# Patient Record
Sex: Female | Born: 1955 | Race: Black or African American | Hispanic: No | Marital: Single | Smoking: Never smoker
Health system: Southern US, Community
[De-identification: ages and names within clinical notes are randomized; demographics above are authoritative.]

## PROBLEM LIST (undated history)

## (undated) DIAGNOSIS — I10 Essential (primary) hypertension: Secondary | ICD-10-CM

## (undated) DIAGNOSIS — J45909 Unspecified asthma, uncomplicated: Secondary | ICD-10-CM

## (undated) DIAGNOSIS — E119 Type 2 diabetes mellitus without complications: Secondary | ICD-10-CM

## (undated) DIAGNOSIS — U071 COVID-19: Secondary | ICD-10-CM

## (undated) HISTORY — PX: ABDOMINAL HYSTERECTOMY: SHX81

## (undated) HISTORY — PX: OTHER SURGICAL HISTORY: SHX169

---

## 2013-11-25 ENCOUNTER — Other Ambulatory Visit: Payer: Self-pay | Admitting: Orthopedic Surgery

## 2013-11-25 DIAGNOSIS — M545 Low back pain, unspecified: Secondary | ICD-10-CM

## 2013-11-25 DIAGNOSIS — R200 Anesthesia of skin: Secondary | ICD-10-CM

## 2013-11-25 DIAGNOSIS — R202 Paresthesia of skin: Secondary | ICD-10-CM

## 2013-12-02 ENCOUNTER — Ambulatory Visit
Admission: RE | Admit: 2013-12-02 | Discharge: 2013-12-02 | Disposition: A | Discharge status: Home / Self Care | Payer: Medicare Other | Source: Ambulatory Visit | Attending: Orthopedic Surgery | Admitting: Orthopedic Surgery

## 2013-12-02 DIAGNOSIS — R200 Anesthesia of skin: Secondary | ICD-10-CM

## 2013-12-02 DIAGNOSIS — M545 Low back pain, unspecified: Secondary | ICD-10-CM

## 2013-12-02 DIAGNOSIS — R202 Paresthesia of skin: Secondary | ICD-10-CM

## 2019-05-23 ENCOUNTER — Other Ambulatory Visit: Payer: Self-pay

## 2019-05-23 ENCOUNTER — Emergency Department (HOSPITAL_COMMUNITY): Payer: Medicare Other

## 2019-05-23 ENCOUNTER — Encounter (HOSPITAL_COMMUNITY): Payer: Self-pay

## 2019-05-23 ENCOUNTER — Emergency Department (HOSPITAL_COMMUNITY)
Admission: EM | Admit: 2019-05-23 | Discharge: 2019-05-23 | Disposition: A | Discharge status: Home / Self Care | Payer: Medicare Other | Source: Home / Self Care | Attending: Emergency Medicine | Admitting: Emergency Medicine

## 2019-05-23 DIAGNOSIS — R197 Diarrhea, unspecified: Secondary | ICD-10-CM

## 2019-05-23 DIAGNOSIS — I10 Essential (primary) hypertension: Secondary | ICD-10-CM | POA: Insufficient documentation

## 2019-05-23 DIAGNOSIS — U071 COVID-19: Secondary | ICD-10-CM | POA: Insufficient documentation

## 2019-05-23 DIAGNOSIS — R1084 Generalized abdominal pain: Secondary | ICD-10-CM

## 2019-05-23 DIAGNOSIS — E119 Type 2 diabetes mellitus without complications: Secondary | ICD-10-CM | POA: Insufficient documentation

## 2019-05-23 DIAGNOSIS — E876 Hypokalemia: Secondary | ICD-10-CM

## 2019-05-23 DIAGNOSIS — R112 Nausea with vomiting, unspecified: Secondary | ICD-10-CM

## 2019-05-23 DIAGNOSIS — J45909 Unspecified asthma, uncomplicated: Secondary | ICD-10-CM | POA: Insufficient documentation

## 2019-05-23 HISTORY — DX: Unspecified asthma, uncomplicated: J45.909

## 2019-05-23 HISTORY — DX: Type 2 diabetes mellitus without complications: E11.9

## 2019-05-23 HISTORY — DX: Essential (primary) hypertension: I10

## 2019-05-23 LAB — COMPREHENSIVE METABOLIC PANEL
ALT: 39 U/L (ref 0–44)
AST: 36 U/L (ref 15–41)
Albumin: 3.8 g/dL (ref 3.5–5.0)
Alkaline Phosphatase: 64 U/L (ref 38–126)
Anion gap: 10 (ref 5–15)
BUN: 8 mg/dL (ref 8–23)
CO2: 25 mmol/L (ref 22–32)
Calcium: 8.6 mg/dL — ABNORMAL LOW (ref 8.9–10.3)
Chloride: 103 mmol/L (ref 98–111)
Creatinine, Ser: 0.86 mg/dL (ref 0.44–1.00)
GFR calc Af Amer: >60 mL/min (ref >60)
GFR calc non Af Amer: >60 mL/min (ref >60)
Glucose, Bld: 112 mg/dL — ABNORMAL HIGH (ref 70–99)
Potassium: 3.3 mmol/L — ABNORMAL LOW (ref 3.5–5.1)
Sodium: 138 mmol/L (ref 135–145)
Total Bilirubin: 1 mg/dL (ref 0.3–1.2)
Total Protein: 7.4 g/dL (ref 6.5–8.1)

## 2019-05-23 LAB — CBC WITH DIFFERENTIAL/PLATELET
Abs Immature Granulocytes: 0.01 10*3/uL (ref 0.00–0.07)
Basophils Absolute: 0 10*3/uL (ref 0.0–0.1)
Basophils Relative: 0 %
Eosinophils Absolute: 0 10*3/uL (ref 0.0–0.5)
Eosinophils Relative: 0 %
HCT: 40.8 % (ref 36.0–46.0)
Hemoglobin: 13.2 g/dL (ref 12.0–15.0)
Immature Granulocytes: 0 %
Lymphocytes Relative: 20 %
Lymphs Abs: 1.1 10*3/uL (ref 0.7–4.0)
MCH: 27.8 pg (ref 26.0–34.0)
MCHC: 32.4 g/dL (ref 30.0–36.0)
MCV: 85.9 fL (ref 80.0–100.0)
Monocytes Absolute: 0.4 10*3/uL (ref 0.1–1.0)
Monocytes Relative: 7 %
Neutro Abs: 3.9 10*3/uL (ref 1.7–7.7)
Neutrophils Relative %: 73 %
Platelets: 172 10*3/uL (ref 150–400)
RBC: 4.75 MIL/uL (ref 3.87–5.11)
RDW: 13.2 % (ref 11.5–15.5)
WBC: 5.4 10*3/uL (ref 4.0–10.5)
nRBC: 0 % (ref 0.0–0.2)

## 2019-05-23 LAB — URINALYSIS, ROUTINE W REFLEX MICROSCOPIC
Bacteria, UA: NONE SEEN
Bilirubin Urine: NEGATIVE
Glucose, UA: NEGATIVE mg/dL
Ketones, ur: NEGATIVE mg/dL
Leukocytes,Ua: NEGATIVE
Nitrite: NEGATIVE
Protein, ur: NEGATIVE mg/dL
Specific Gravity, Urine: 1.008 (ref 1.005–1.030)
pH: 6 (ref 5.0–8.0)

## 2019-05-23 LAB — LIPASE, BLOOD: Lipase: 39 U/L (ref 11–51)

## 2019-05-23 MED ORDER — SODIUM CHLORIDE 0.9 % IV BOLUS
500.0000 mL | Freq: Once | INTRAVENOUS | Status: AC
  Administered 2019-05-23: 500 mL via INTRAVENOUS

## 2019-05-23 MED ORDER — ALUM & MAG HYDROXIDE-SIMETH 200-200-20 MG/5ML PO SUSP
15.0000 mL | Freq: Once | ORAL | Status: AC
  Administered 2019-05-23: 15 mL via ORAL
  Filled 2019-05-23: qty 30

## 2019-05-23 MED ORDER — LOPERAMIDE HCL 2 MG PO CAPS: 2.0000 mg | ORAL_CAPSULE | Freq: Four times a day (QID) | ORAL | 0 refills | Status: AC | PRN

## 2019-05-23 MED ORDER — POTASSIUM CHLORIDE CRYS ER 20 MEQ PO TBCR: 20.0000 meq | EXTENDED_RELEASE_TABLET | Freq: Two times a day (BID) | ORAL | 0 refills | Status: DC

## 2019-05-23 MED ORDER — DICYCLOMINE HCL 10 MG/ML IM SOLN
20.0000 mg | Freq: Once | INTRAMUSCULAR | Status: AC
  Administered 2019-05-23: 09:00:00 20 mg via INTRAMUSCULAR
  Filled 2019-05-23 (×2): qty 2

## 2019-05-23 MED ORDER — DICYCLOMINE HCL 20 MG PO TABS: 20.0000 mg | ORAL_TABLET | Freq: Two times a day (BID) | ORAL | 0 refills | Status: AC

## 2019-05-23 MED ORDER — ONDANSETRON HCL 4 MG/2ML IJ SOLN
4.0000 mg | Freq: Once | INTRAMUSCULAR | Status: AC
  Administered 2019-05-23: 09:00:00 4 mg via INTRAVENOUS
  Filled 2019-05-23: qty 2

## 2019-05-23 NOTE — Discharge Instructions (Signed)
Person Under Monitoring Name: Tanya Boyd  Location: [place] [address]   Infection Prevention Recommendations for Individuals Confirmed to have, or Being Evaluated for, 2019 Novel Coronavirus (COVID-19) Infection Who Receive Care at Home  Individuals who are confirmed to have, or are being evaluated for, COVID-19 should follow the prevention steps below until a healthcare provider or local or state health department says they can return to normal activities.  Stay home except to get medical care You should restrict activities outside your home, except for getting medical care. Do not go to work, school, or public areas, and do not use public transportation or taxis.  Call ahead before visiting your doctor Before your medical appointment, call the healthcare provider and tell them that you have, or are being evaluated for, COVID-19 infection. This will help the healthcare providers office take steps to keep other people from getting infected. Ask your healthcare provider to call the local or state health department.  Monitor your symptoms Seek prompt medical attention if your illness is worsening (e.g., difficulty breathing). Before going to your medical appointment, call the healthcare provider and tell them that you have, or are being evaluated for, COVID-19 infection. Ask your healthcare provider to call the local or state health department.  Wear a facemask You should wear a facemask that covers your nose and mouth when you are in the same room with other people and when you visit a healthcare provider. People who live with or visit you should also wear a facemask while they are in the same room with you.  Separate yourself from other people in your home As much as possible, you should stay in a different room from other people in your home. Also, you should use a separate bathroom, if available.  Avoid sharing household items You should not  share dishes, drinking glasses, cups, eating utensils, towels, bedding, or other items with other people in your home. After using these items, you should wash them thoroughly with soap and water.  Cover your coughs and sneezes Cover your mouth and nose with a tissue when you cough or sneeze, or you can cough or sneeze into your sleeve. Throw used tissues in a lined trash can, and immediately wash your hands with soap and water for at least 20 seconds or use an alcohol-based hand rub.  Wash your [facility] your hands often and thoroughly with soap and water for at least 20 seconds. You can use an alcohol-based hand sanitizer if soap and water are not available and if your hands are not visibly dirty. Avoid touching your eyes, nose, and mouth with unwashed hands.   Prevention Steps for Caregivers and Household Members of Individuals Confirmed to have, or Being Evaluated for, COVID-19 Infection Being Cared for in the Home  If you live with, or provide care at home for, a person confirmed to have, or being evaluated for, COVID-19 infection please follow these guidelines to prevent infection:  Follow healthcare providers instructions Make sure that you understand and can help the patient follow any healthcare provider instructions for all care.  Provide for the patients basic needs You should help the patient with basic needs in the home and provide support for getting groceries, prescriptions, and other personal needs.  Monitor the patients symptoms If they are getting sicker, call his or her medical provider and tell them that the patient has, or is being evaluated for, COVID-19 infection. This will help the healthcare providers office  take steps to keep other people from getting infected. Ask the healthcare provider to call the local or state health department.  Limit the number of people who have contact with the patient If possible, have only one caregiver for the  patient. Other household members should stay in another home or place of residence. If this is not possible, they should stay in another room, or be separated from the patient as much as possible. Use a separate bathroom, if available. Restrict visitors who do not have an essential need to be in the home.  Keep older adults, very young children, and other sick people away from the patient Keep older adults, very young children, and those who have compromised immune systems or chronic health conditions away from the patient. This includes people with chronic heart, lung, or kidney conditions, diabetes, and cancer.  Ensure good ventilation Make sure that shared spaces in the home have good air flow, such as from an air conditioner or an opened window, weather permitting.  Wash your hands often Wash your hands often and thoroughly with soap and water for at least 20 seconds. You can use an alcohol based hand sanitizer if soap and water are not available and if your hands are not visibly dirty. Avoid touching your eyes, nose, and mouth with unwashed hands. Use disposable paper towels to dry your hands. If not available, use dedicated cloth towels and replace them when they become wet.  Wear a facemask and gloves Wear a disposable facemask at all times in the room and gloves when you touch or have contact with the patients blood, body fluids, and/or secretions or excretions, such as sweat, saliva, sputum, nasal mucus, vomit, urine, or feces.  Ensure the mask fits over your nose and mouth tightly, and do not touch it during use. Throw out disposable facemasks and gloves after using them. Do not reuse. Wash your hands immediately after removing your facemask and gloves. If your personal clothing becomes contaminated, carefully remove clothing and launder. Wash your hands after handling contaminated clothing. Place all used disposable facemasks, gloves, and other waste in a lined container before  disposing them with other household waste. Remove gloves and wash your hands immediately after handling these items.  Do not share dishes, glasses, or other household items with the patient Avoid sharing household items. You should not share dishes, drinking glasses, cups, eating utensils, towels, bedding, or other items with a patient who is confirmed to have, or being evaluated for, COVID-19 infection. After the person uses these items, you should wash them thoroughly with soap and water.  Wash laundry thoroughly Immediately remove and wash clothes or bedding that have blood, body fluids, and/or secretions or excretions, such as sweat, saliva, sputum, nasal mucus, vomit, urine, or feces, on them. Wear gloves when handling laundry from the patient. Read and follow directions on labels of laundry or clothing items and detergent. In general, wash and dry with the warmest temperatures recommended on the label.  Clean all areas the individual has used often Clean all touchable surfaces, such as counters, tabletops, doorknobs, bathroom fixtures, toilets, phones, keyboards, tablets, and bedside tables, every day. Also, clean any surfaces that may have blood, body fluids, and/or secretions or excretions on them. Wear gloves when cleaning surfaces the patient has come in contact with. Use a diluted bleach solution (e.g., dilute bleach with 1 part bleach and 10 parts water) or a household disinfectant with a label that says EPA-registered for coronaviruses. To make a bleach  solution at home, add 1 tablespoon of bleach to 1 quart (4 cups) of water. For a larger supply, add  cup of bleach to 1 gallon (16 cups) of water. Read labels of cleaning products and follow recommendations provided on product labels. Labels contain instructions for safe and effective use of the cleaning product including precautions you should take when applying the product, such as wearing gloves or eye protection and making sure you  have good ventilation during use of the product. Remove gloves and wash hands immediately after cleaning.  Monitor yourself for signs and symptoms of illness Caregivers and household members are considered close contacts, should monitor their health, and will be asked to limit movement outside of the home to the extent possible. Follow the monitoring steps for close contacts listed on the symptom monitoring form.   ? If you have additional questions, contact your local health department or call the epidemiologist on call at [phone] (available 24/7). ? This guidance is subject to change. For the most up-to-date guidance from [facility], please refer to their website: [email or web address]

## 2019-05-23 NOTE — ED Provider Notes (Signed)
Emergency Department Provider Note   I have reviewed the triage vital signs and the nursing notes.   HISTORY  Chief Complaint Abdominal Pain and covid   HPI Tanya Boyd is a 63 y.o. female with PMH of asthma, DM, and HTN presents to the emergency department for evaluation of cough, abdominal pain, vomiting, diarrhea.  Symptoms have been present for the past 4 days.  She was tested for COVID-19 last week and her test came back positive on Sunday.  She denies significant shortness of breath or chest pain.  She does feel fatigue and body aches.  She denies blood in the emesis or stool.  She has multiple episodes of both vomiting and diarrhea per day.  She is drinking and eating less than normal.  She has been taking omeprazole which was initially prescribed for her abdominal discomfort but is not helping.  She also notes taking a Zofran with some mild, transient relief.    Past Medical History:  Diagnosis Date  . Asthma   . Diabetes mellitus without complication (HCC)   . Hypertension     There are no active problems to display for this patient.   Past Surgical History:  Procedure Laterality Date  . ABDOMINAL HYSTERECTOMY    . arm surgery      Allergies Lisinopril and Penicillins  No family history on file.  Social History Social History   Tobacco Use  . Smoking status: Never Smoker  . Smokeless tobacco: Never Used  Substance Use Topics  . Alcohol use: Never    Frequency: Never  . Drug use: Never    Review of Systems  Constitutional: Positive fever/chills and body aches.  Eyes: No visual changes. ENT: No sore throat. Cardiovascular: Denies chest pain. Respiratory: Denies shortness of breath. Positive cough.  Gastrointestinal: Positive diffuse abdominal pain. Positive nausea, vomiting, and diarrhea.  No constipation. Genitourinary: Negative for dysuria. Musculoskeletal: Negative for back pain. Skin: Negative for rash. Neurological: Negative for  headaches, focal weakness or numbness.  10-point ROS otherwise negative.  ____________________________________________   PHYSICAL EXAM:  VITAL SIGNS: ED Triage Vitals  Enc Vitals Group     BP 05/23/19 0826 114/77     Pulse Rate 05/23/19 0826 69     Resp 05/23/19 0826 18     Temp 05/23/19 0826 99.5 F (37.5 C)     Temp Source 05/23/19 0826 Oral     SpO2 05/23/19 0826 96 %     Weight 05/23/19 0822 226 lb (102.5 kg)     Height 05/23/19 0822 5\' 7"  (1.702 m)   Constitutional: Alert and oriented. Well appearing and in no acute distress. Eyes: Conjunctivae are normal.  Head: Atraumatic. Nose: No congestion/rhinnorhea. Mouth/Throat: Mucous membranes are moist.   Neck: No stridor.   Cardiovascular: Normal rate, regular rhythm. Good peripheral circulation. Grossly normal heart sounds.   Respiratory: Normal respiratory effort.  No retractions. Lungs CTAB. Gastrointestinal: Soft with mild, diffuse tenderness. No rebound or guarding. No distention.  Musculoskeletal: No lower extremity tenderness nor edema. No gross deformities of extremities. Neurologic:  Normal speech and language. Skin:  Skin is warm, dry and intact. No rash noted.  ____________________________________________   LABS (all labs ordered are listed, but only abnormal results are displayed)  Labs Reviewed  COMPREHENSIVE METABOLIC PANEL - Abnormal; Notable for the following components:      Result Value   Potassium 3.3 (*)    Glucose, Bld 112 (*)    Calcium 8.6 (*)    All other  components within normal limits  URINALYSIS, ROUTINE W REFLEX MICROSCOPIC - Abnormal; Notable for the following components:   Hgb urine dipstick SMALL (*)    All other components within normal limits  LIPASE, BLOOD  CBC WITH DIFFERENTIAL/PLATELET   ____________________________________________  EKG   EKG Interpretation  Date/Time:  Thursday May 23 2019 08:58:03 EDT Ventricular Rate:  69 PR Interval:    QRS Duration: 88 QT  Interval:  440 QTC Calculation: 472 R Axis:   63 Text Interpretation:  Sinus rhythm Multiple premature complexes, vent & supraven Borderline short PR interval Abnormal R-wave progression, early transition T wave inversions anterior and lateral. No STEMI.  No old tracing for comparison.  Confirmed by Alona Bene [phone]) on 05/23/2019 9:39:21 AM       ____________________________________________  RADIOLOGY  Dg Chest Portable 1 View  Result Date: 05/23/2019 CLINICAL DATA:  COVID-19, cough, abdominal pain, vomiting EXAM: PORTABLE CHEST 1 VIEW COMPARISON:  None. FINDINGS: Cardiomegaly. There is subtle bilateral heterogeneous opacity, most conspicuous in the peripheral mid lungs. Overlying soft tissue may exaggerate this appearance. The visualized skeletal structures are unremarkable. IMPRESSION: Cardiomegaly. There is subtle bilateral heterogeneous opacity, most conspicuous in the peripheral mid lungs, generally in keeping with reported COVID-19 diagnosis. Overlying soft tissue may exaggerate this appearance. Electronically Signed   By: Lauralyn Primes M.D.   On: 05/23/2019 10:56    ____________________________________________   PROCEDURES  Procedure(s) performed:   Procedures  None ____________________________________________   INITIAL IMPRESSION / ASSESSMENT AND PLAN / ED COURSE  Pertinent labs & imaging results that were available during my care of the patient were reviewed by me and considered in my medical decision making (see chart for details).   Patient with known COVID-19 infection presents to the emergency department primarily with gastrointestinal symptoms.  She reports vomiting and diarrhea.  Her abdominal pain is diffuse with mild tenderness on exam.  Suspect that this is consistent with her viral syndrome.  No focal findings on exam to require CT imaging at this time.  Patient is having cough but no other cardiovascular or pulmonary symptoms at this time.  No hypoxemia.   Afebrile here.  Plan for IV fluids, symptom management, screening lab work, chest x-ray, reassess.  Labs reviewed. No acute findings. CXR unremarkable. Patient improved with supportive care in the ED. Discussed ED return precautions. Will remain isolated.   Charman Collum was evaluated in Emergency Department on 05/23/2019 for the symptoms described in the history of present illness. She was evaluated in the context of the global COVID-19 pandemic, which necessitated consideration that the patient might be at risk for infection with the SARS-CoV-2 virus that causes COVID-19. Institutional protocols and algorithms that pertain to the evaluation of patients at risk for COVID-19 are in a state of rapid change based on information released by regulatory bodies including the CDC and federal and state organizations. These policies and algorithms were followed during the patient's care in the ED.  ____________________________________________  FINAL CLINICAL IMPRESSION(S) / ED DIAGNOSES  Final diagnoses:  Generalized abdominal pain  Nausea vomiting and diarrhea  COVID-19  Hypokalemia     MEDICATIONS GIVEN DURING THIS VISIT:  Medications  sodium chloride 0.9 % bolus 500 mL (0 mLs Intravenous Stopped 05/23/19 1033)  dicyclomine (BENTYL) injection 20 mg (20 mg Intramuscular Given 05/23/19 0909)  ondansetron (ZOFRAN) injection 4 mg (4 mg Intravenous Given 05/23/19 0909)  alum & mag hydroxide-simeth (MAALOX/MYLANTA) 200-200-20 MG/5ML suspension 15 mL (15 mLs Oral Given 05/23/19 0910)  NEW OUTPATIENT MEDICATIONS STARTED DURING THIS VISIT:  Discharge Medication List as of 05/23/2019 11:23 AM    START taking these medications   Details  dicyclomine (BENTYL) 20 MG tablet Take 1 tablet (20 mg total) by mouth 2 (two) times daily., Starting Thu 05/23/2019, Normal    loperamide (IMODIUM) 2 MG capsule Take 1 capsule (2 mg total) by mouth 4 (four) times daily as needed for diarrhea or loose stools.,  Starting Thu 05/23/2019, Normal    potassium chloride SA (K-DUR) 20 MEQ tablet Take 1 tablet (20 mEq total) by mouth 2 (two) times daily for 5 days., Starting Thu 05/23/2019, Until Tue 05/28/2019, Normal        Note:  This document was prepared using Dragon voice recognition software and may include unintentional dictation errors.  Nanda Quinton, MD Emergency Medicine    Long, [facility], MD 05/23/19 2011

## 2019-05-23 NOTE — ED Notes (Signed)
Computer not working in room 19, unable to obtain d/c signature.  pt verbalized understanding of d/c instructions.

## 2019-05-23 NOTE — ED Triage Notes (Signed)
Pt reports cough, abd pain, vomiting, and diarrhea since sat.  Reports tested positive for covid Sunday.

## 2019-05-26 ENCOUNTER — Inpatient Hospital Stay (HOSPITAL_COMMUNITY)
Admission: EM | Admit: 2019-05-26 | Discharge: 2019-05-28 | DRG: 177 | Disposition: A | Discharge status: Home / Self Care | Payer: Medicare Other | Attending: Internal Medicine | Admitting: Internal Medicine

## 2019-05-26 ENCOUNTER — Other Ambulatory Visit: Payer: Self-pay

## 2019-05-26 ENCOUNTER — Encounter (HOSPITAL_COMMUNITY): Payer: Self-pay

## 2019-05-26 ENCOUNTER — Emergency Department (HOSPITAL_COMMUNITY): Payer: Medicare Other

## 2019-05-26 DIAGNOSIS — R0602 Shortness of breath: Secondary | ICD-10-CM

## 2019-05-26 DIAGNOSIS — J069 Acute upper respiratory infection, unspecified: Secondary | ICD-10-CM | POA: Diagnosis present

## 2019-05-26 DIAGNOSIS — E119 Type 2 diabetes mellitus without complications: Secondary | ICD-10-CM | POA: Diagnosis present

## 2019-05-26 DIAGNOSIS — Z888 Allergy status to other drugs, medicaments and biological substances status: Secondary | ICD-10-CM

## 2019-05-26 DIAGNOSIS — I10 Essential (primary) hypertension: Secondary | ICD-10-CM

## 2019-05-26 DIAGNOSIS — J1289 Other viral pneumonia: Secondary | ICD-10-CM | POA: Diagnosis present

## 2019-05-26 DIAGNOSIS — Z7984 Long term (current) use of oral hypoglycemic drugs: Secondary | ICD-10-CM | POA: Diagnosis not present

## 2019-05-26 DIAGNOSIS — Z79899 Other long term (current) drug therapy: Secondary | ICD-10-CM

## 2019-05-26 DIAGNOSIS — U071 COVID-19: Principal | ICD-10-CM | POA: Diagnosis present

## 2019-05-26 DIAGNOSIS — J45901 Unspecified asthma with (acute) exacerbation: Secondary | ICD-10-CM | POA: Diagnosis present

## 2019-05-26 DIAGNOSIS — R0902 Hypoxemia: Secondary | ICD-10-CM | POA: Diagnosis present

## 2019-05-26 DIAGNOSIS — Z7982 Long term (current) use of aspirin: Secondary | ICD-10-CM

## 2019-05-26 DIAGNOSIS — Z880 Allergy status to penicillin: Secondary | ICD-10-CM

## 2019-05-26 DIAGNOSIS — E876 Hypokalemia: Secondary | ICD-10-CM | POA: Diagnosis not present

## 2019-05-26 DIAGNOSIS — Z90710 Acquired absence of both cervix and uterus: Secondary | ICD-10-CM

## 2019-05-26 DIAGNOSIS — I119 Hypertensive heart disease without heart failure: Secondary | ICD-10-CM | POA: Diagnosis not present

## 2019-05-26 DIAGNOSIS — R1084 Generalized abdominal pain: Secondary | ICD-10-CM | POA: Diagnosis present

## 2019-05-26 DIAGNOSIS — Z7951 Long term (current) use of inhaled steroids: Secondary | ICD-10-CM

## 2019-05-26 LAB — BASIC METABOLIC PANEL
Anion gap: 11 (ref 5–15)
BUN: 8 mg/dL (ref 8–23)
CO2: 23 mmol/L (ref 22–32)
Calcium: 8.8 mg/dL — ABNORMAL LOW (ref 8.9–10.3)
Chloride: 106 mmol/L (ref 98–111)
Creatinine, Ser: 0.84 mg/dL (ref 0.44–1.00)
GFR calc Af Amer: >60 mL/min (ref >60)
GFR calc non Af Amer: >60 mL/min (ref >60)
Glucose, Bld: 106 mg/dL — ABNORMAL HIGH (ref 70–99)
Potassium: 3.4 mmol/L — ABNORMAL LOW (ref 3.5–5.1)
Sodium: 140 mmol/L (ref 135–145)

## 2019-05-26 LAB — D-DIMER, QUANTITATIVE: D-Dimer, Quant: 0.46 ug/mL-FEU (ref 0.00–0.50)

## 2019-05-26 LAB — PROCALCITONIN: Procalcitonin: <0.1 ng/mL

## 2019-05-26 LAB — HEPATIC FUNCTION PANEL
ALT: 26 U/L (ref 0–44)
AST: 28 U/L (ref 15–41)
Albumin: 3.5 g/dL (ref 3.5–5.0)
Alkaline Phosphatase: 62 U/L (ref 38–126)
Bilirubin, Direct: 0.2 mg/dL (ref 0.0–0.2)
Indirect Bilirubin: 0.8 mg/dL (ref 0.3–0.9)
Total Bilirubin: 1 mg/dL (ref 0.3–1.2)
Total Protein: 7.3 g/dL (ref 6.5–8.1)

## 2019-05-26 LAB — CBC WITH DIFFERENTIAL/PLATELET
Abs Immature Granulocytes: 0.02 10*3/uL (ref 0.00–0.07)
Basophils Absolute: 0 10*3/uL (ref 0.0–0.1)
Basophils Relative: 0 %
Eosinophils Absolute: 0 10*3/uL (ref 0.0–0.5)
Eosinophils Relative: 0 %
HCT: 40.6 % (ref 36.0–46.0)
Hemoglobin: 12.9 g/dL (ref 12.0–15.0)
Immature Granulocytes: 0 %
Lymphocytes Relative: 18 %
Lymphs Abs: 1.3 10*3/uL (ref 0.7–4.0)
MCH: 27.5 pg (ref 26.0–34.0)
MCHC: 31.8 g/dL (ref 30.0–36.0)
MCV: 86.6 fL (ref 80.0–100.0)
Monocytes Absolute: 0.3 10*3/uL (ref 0.1–1.0)
Monocytes Relative: 5 %
Neutro Abs: 5.2 10*3/uL (ref 1.7–7.7)
Neutrophils Relative %: 77 %
Platelets: 191 10*3/uL (ref 150–400)
RBC: 4.69 MIL/uL (ref 3.87–5.11)
RDW: 13.4 % (ref 11.5–15.5)
WBC: 6.8 10*3/uL (ref 4.0–10.5)
nRBC: 0 % (ref 0.0–0.2)

## 2019-05-26 LAB — SEDIMENTATION RATE: Sed Rate: 45 mm/hr — ABNORMAL HIGH (ref 0–22)

## 2019-05-26 LAB — LACTIC ACID, PLASMA
Lactic Acid, Venous: 1.2 mmol/L (ref 0.5–1.9)
Lactic Acid, Venous: 1.2 mmol/L (ref 0.5–1.9)

## 2019-05-26 LAB — LACTATE DEHYDROGENASE: LDH: 208 U/L — ABNORMAL HIGH (ref 98–192)

## 2019-05-26 LAB — BLOOD GAS, ARTERIAL
Acid-base deficit: 0.8 mmol/L (ref 0.0–2.0)
Bicarbonate: 24.2 mmol/L (ref 20.0–28.0)
FIO2: 21
O2 Saturation: 94.8 %
Patient temperature: 37.2
pCO2 arterial: 33.5 mmHg (ref 32.0–48.0)
pH, Arterial: 7.447 (ref 7.350–7.450)
pO2, Arterial: 75.5 mmHg — ABNORMAL LOW (ref 83.0–108.0)

## 2019-05-26 LAB — FIBRINOGEN: Fibrinogen: 655 mg/dL — ABNORMAL HIGH (ref 210–475)

## 2019-05-26 LAB — GLUCOSE, CAPILLARY: Glucose-Capillary: 183 mg/dL — ABNORMAL HIGH (ref 70–99)

## 2019-05-26 LAB — SARS CORONAVIRUS 2 BY RT PCR (HOSPITAL ORDER, PERFORMED IN ~~LOC~~ HOSPITAL LAB): SARS Coronavirus 2: POSITIVE — AB

## 2019-05-26 LAB — TROPONIN I (HIGH SENSITIVITY)
Troponin I (High Sensitivity): 6 ng/L (ref <18)
Troponin I (High Sensitivity): 6 ng/L (ref <18)

## 2019-05-26 LAB — MAGNESIUM: Magnesium: 1.9 mg/dL (ref 1.7–2.4)

## 2019-05-26 LAB — C-REACTIVE PROTEIN: CRP: 6 mg/dL — ABNORMAL HIGH (ref <1.0)

## 2019-05-26 LAB — FERRITIN: Ferritin: 400 ng/mL — ABNORMAL HIGH (ref 11–307)

## 2019-05-26 MED ORDER — MOMETASONE FURO-FORMOTEROL FUM 200-5 MCG/ACT IN AERO
2.0000 | INHALATION_SPRAY | Freq: Two times a day (BID) | RESPIRATORY_TRACT | Status: DC
  Administered 2019-05-27 – 2019-05-28 (×4): 2 via RESPIRATORY_TRACT
  Filled 2019-05-26: qty 8.8

## 2019-05-26 MED ORDER — INSULIN ASPART 100 UNIT/ML ~~LOC~~ SOLN
0.0000 [IU] | Freq: Three times a day (TID) | SUBCUTANEOUS | Status: DC
  Administered 2019-05-27 (×2): 3 [IU] via SUBCUTANEOUS
  Administered 2019-05-27: 2 [IU] via SUBCUTANEOUS
  Administered 2019-05-28: 12:00:00 3 [IU] via SUBCUTANEOUS
  Administered 2019-05-28: 08:00:00 2 [IU] via SUBCUTANEOUS

## 2019-05-26 MED ORDER — GUAIFENESIN-DM 100-10 MG/5ML PO SYRP: 10.0000 mL | ORAL_SOLUTION | ORAL | Status: DC | PRN

## 2019-05-26 MED ORDER — HYDROCOD POLST-CPM POLST ER 10-8 MG/5ML PO SUER: 5.0000 mL | Freq: Two times a day (BID) | ORAL | Status: DC | PRN

## 2019-05-26 MED ORDER — ONDANSETRON HCL 4 MG PO TABS: 4.0000 mg | ORAL_TABLET | Freq: Four times a day (QID) | ORAL | Status: DC | PRN

## 2019-05-26 MED ORDER — PANTOPRAZOLE SODIUM 40 MG PO TBEC
40.0000 mg | DELAYED_RELEASE_TABLET | Freq: Every day | ORAL | Status: DC
  Administered 2019-05-27 – 2019-05-28 (×2): 40 mg via ORAL
  Filled 2019-05-26 (×2): qty 1

## 2019-05-26 MED ORDER — POTASSIUM CHLORIDE CRYS ER 20 MEQ PO TBCR
40.0000 meq | EXTENDED_RELEASE_TABLET | Freq: Once | ORAL | Status: AC
  Administered 2019-05-26: 40 meq via ORAL
  Filled 2019-05-26: qty 2

## 2019-05-26 MED ORDER — ENOXAPARIN SODIUM 60 MG/0.6ML ~~LOC~~ SOLN
50.0000 mg | SUBCUTANEOUS | Status: DC
  Administered 2019-05-26 – 2019-05-27 (×2): 50 mg via SUBCUTANEOUS
  Filled 2019-05-26 (×2): qty 0.6

## 2019-05-26 MED ORDER — DEXAMETHASONE SODIUM PHOSPHATE 10 MG/ML IJ SOLN
6.0000 mg | Freq: Once | INTRAMUSCULAR | Status: AC
  Administered 2019-05-26: 14:00:00 6 mg via INTRAVENOUS
  Filled 2019-05-26: qty 1

## 2019-05-26 MED ORDER — ALBUTEROL SULFATE HFA 108 (90 BASE) MCG/ACT IN AERS
2.0000 | INHALATION_SPRAY | RESPIRATORY_TRACT | Status: DC | PRN
  Filled 2019-05-26: qty 6.7

## 2019-05-26 MED ORDER — AMLODIPINE BESYLATE 5 MG PO TABS
5.0000 mg | ORAL_TABLET | Freq: Every day | ORAL | Status: DC
  Administered 2019-05-26 – 2019-05-27 (×2): 5 mg via ORAL
  Filled 2019-05-26 (×2): qty 1

## 2019-05-26 MED ORDER — INSULIN ASPART 100 UNIT/ML ~~LOC~~ SOLN: 0.0000 [IU] | Freq: Every day | SUBCUTANEOUS | Status: DC

## 2019-05-26 MED ORDER — LOSARTAN POTASSIUM 25 MG PO TABS
25.0000 mg | ORAL_TABLET | Freq: Every day | ORAL | Status: DC
  Administered 2019-05-27: 08:00:00 25 mg via ORAL
  Filled 2019-05-26: qty 1

## 2019-05-26 MED ORDER — DICYCLOMINE HCL 20 MG PO TABS
20.0000 mg | ORAL_TABLET | Freq: Two times a day (BID) | ORAL | Status: DC
  Administered 2019-05-27 – 2019-05-28 (×4): 20 mg via ORAL
  Filled 2019-05-26 (×6): qty 1

## 2019-05-26 MED ORDER — VALACYCLOVIR HCL 500 MG PO TABS
500.0000 mg | ORAL_TABLET | Freq: Every day | ORAL | Status: DC
  Administered 2019-05-27 – 2019-05-28 (×2): 500 mg via ORAL
  Filled 2019-05-26 (×3): qty 1

## 2019-05-26 MED ORDER — ASPIRIN 81 MG PO TBEC
81.0000 mg | DELAYED_RELEASE_TABLET | Freq: Every day | ORAL | Status: DC
  Administered 2019-05-26 – 2019-05-28 (×3): 81 mg via ORAL
  Filled 2019-05-26 (×6): qty 1

## 2019-05-26 MED ORDER — DEXAMETHASONE SODIUM PHOSPHATE 10 MG/ML IJ SOLN
6.0000 mg | INTRAMUSCULAR | Status: DC
  Administered 2019-05-26: 22:00:00 6 mg via INTRAVENOUS
  Filled 2019-05-26: qty 1

## 2019-05-26 MED ORDER — ONDANSETRON HCL 4 MG/2ML IJ SOLN
4.0000 mg | Freq: Four times a day (QID) | INTRAMUSCULAR | Status: DC | PRN
  Administered 2019-05-27 (×2): 4 mg via INTRAVENOUS
  Filled 2019-05-26 (×2): qty 2

## 2019-05-26 NOTE — ED Notes (Signed)
Lunch provided.

## 2019-05-26 NOTE — ED Notes (Signed)
Call to Daughter to relate room number and give update

## 2019-05-26 NOTE — ED Notes (Signed)
hospitalist in to assess  

## 2019-05-26 NOTE — ED Notes (Signed)
Pt reports she feels better  Appears more comfortable  Awaiting transfer to [facility] by [facility]

## 2019-05-26 NOTE — ED Provider Notes (Signed)
[facility] EMERGENCY DEPARTMENT Provider Note   CSN: [id number] Arrival date & time: 05/26/19  0804     History   Chief Complaint Chief Complaint  Patient presents with   Shortness of Breath   covid    HPI Tanya Boyd is a 63 y.o. female.      Shortness of Breath   Pt was seen at 0825. Per pt, c/o gradual onset and persistence of constant SOB since last night. Pt states her SOB is worse when she lays down. Pt states she was evaluated by [facility], and was dx with +COVID 1 05/19/19: symptoms included cough, N/V/D, abd pain, fatigue, body aches. Pt was evaluated again in the ED 3 days ago for continued symptoms and was discharged with symptomatic treatment. Pt now c/o increasing SOB. Denies fevers over "100" at home for the past 2 days (was "102" 3 days ago).  Denies CP/palpitations, no abd pain, no back pain, no rash.    Past Medical History:  Diagnosis Date   Asthma    Diabetes mellitus without complication (HCC)    Hypertension     There are no active problems to display for this patient.   Past Surgical History:  Procedure Laterality Date   ABDOMINAL HYSTERECTOMY     arm surgery       OB History   No obstetric history on file.      Home Medications    Prior to Admission medications   Medication Sig Start Date End Date Taking? Authorizing Provider  albuterol (VENTOLIN HFA) 108 (90 Base) MCG/ACT inhaler Inhale 1-2 puffs into the lungs every 6 (six) hours as needed for wheezing or shortness of breath.   Yes [provider]  amLODipine (NORVASC) 5 MG tablet Take 5 mg by mouth daily. 04/25/19  Yes [provider]  aspirin 81 MG EC tablet Take 81 mg by mouth daily.    Yes [provider]  dicyclomine (BENTYL) 20 MG tablet Take 1 tablet (20 mg total) by mouth 2 (two) times daily. 05/23/19  Yes Long, Arlyss Repress, MD  loperamide (IMODIUM) 2 MG capsule Take 1 capsule (2 mg total) by mouth 4 (four) times daily as needed for diarrhea or loose  stools. 05/23/19  Yes Long, Arlyss Repress, MD  losartan (COZAAR) 25 MG tablet Take 25 mg by mouth daily. 03/12/19  Yes [provider]  metFORMIN (GLUCOPHAGE) 500 MG tablet Take 500 mg by mouth daily with breakfast.    Yes [provider]  omeprazole (PRILOSEC) 20 MG capsule Take 20 mg by mouth daily. 05/21/19  Yes [provider]  ondansetron (ZOFRAN) 4 MG tablet TAKE 1 TABLET BY MOUTH EVERY 8 HOURS AS NEEDED FOR NAUSEA AND VOMITING 05/19/19  Yes [provider]  potassium chloride SA (K-DUR) 20 MEQ tablet Take 1 tablet (20 mEq total) by mouth 2 (two) times daily for 5 days. 05/23/19 05/28/19 Yes Long, Arlyss Repress, MD  SYMBICORT 160-4.5 MCG/ACT inhaler Inhale 2 puffs into the lungs 2 (two) times daily. 01/19/19  Yes [provider]  valACYclovir (VALTREX) 500 MG tablet TAKE 1 CAPLET BY MOUTH ONCE DAILY 03/30/19  Yes [provider]    Family History No family history on file.  Social History Social History   Tobacco Use   Smoking status: Never Smoker   Smokeless tobacco: Never Used  Substance Use Topics   Alcohol use: Never    Frequency: Never   Drug use: Never     Allergies   Lisinopril and  Penicillins   Review of Systems Review of Systems  Respiratory: Positive for shortness of breath.   ROS: Statement: All systems negative except as marked or noted in the HPI; Constitutional: Negative for fever and chills. ; ; Eyes: Negative for eye pain, redness and discharge. ; ; ENMT: Negative for ear pain, hoarseness, nasal congestion, sinus pressure and sore throat. ; ; Cardiovascular: Negative for chest pain, palpitations, diaphoresis, and peripheral edema. ; ; Respiratory: +SOB, cough. Negative for wheezing and stridor. ; ; Gastrointestinal: Negative for nausea, vomiting, diarrhea, abdominal pain, blood in stool, hematemesis, jaundice and rectal bleeding. . ; ; Genitourinary: Negative for dysuria, flank pain and hematuria. ; ; Musculoskeletal:  Negative for back pain and neck pain. Negative for swelling and trauma.; ; Skin: Negative for pruritus, rash, abrasions, blisters, bruising and skin lesion.; ; Neuro: Negative for headache, lightheadedness and neck stiffness. Negative for weakness, altered level of consciousness, altered mental status, extremity weakness, paresthesias, involuntary movement, seizure and syncope.        Physical Exam Updated Vital Signs BP 127/71    Pulse 61    Temp 99.1 F (37.3 C) (Oral)    Resp (!) 22    Ht 5\' 7"  (1.702 m)    Wt 102 kg    SpO2 93%    BMI 35.22 kg/m    BP 124/74    Pulse 70    Temp 99.1 F (37.3 C) (Oral)    Resp 18    Ht 5\' 7"  (1.702 m)    Wt 102 kg    SpO2 96%    BMI 35.22 kg/m    Physical Exam 0830: Physical examination:  Nursing notes reviewed; Vital signs and O2 SAT reviewed;  Constitutional: Well developed, Well nourished, Well hydrated, In no acute distress; Head:  Normocephalic, atraumatic; Eyes: EOMI, PERRL, No scleral icterus; ENMT: Mouth and pharynx normal, Mucous membranes moist; Neck: Supple, Full range of motion, No lymphadenopathy; Cardiovascular: Regular rate and rhythm, No gallop; Respiratory: Breath sounds clear & equal bilaterally, No wheezes.  Speaking full sentences with ease, Normal respiratory effort/excursion; Chest: Nontender, Movement normal; Abdomen: Soft, Nontender, Nondistended, Normal bowel sounds; Genitourinary: No CVA tenderness; Extremities: Peripheral pulses normal, No tenderness, No edema, No calf edema or asymmetry.; Neuro: AA&Ox3, Major CN grossly intact.  Speech clear. No gross focal motor or sensory deficits in extremities.; Skin: Color normal, Warm, Dry.     ED Treatments / Results  Labs (all labs ordered are listed, but only abnormal results are displayed)   EKG EKG Interpretation  Date/Time:  Sunday May 26 2019 08:26:51 EDT Ventricular Rate:  71 PR Interval:    QRS Duration: 99 QT Interval:  369 QTC Calculation: 401 R Axis:   65 Text  Interpretation:  Sinus rhythm Atrial premature complexes in couplets Borderline short PR interval Abnormal R-wave progression, early transition T wave abnormality , anterolateral Baseline wander When compared with ECG of 05/23/2019 No significant change was found Confirmed by Francine Graven [phone]) on 05/26/2019 9:31:28 AM   Radiology   Procedures Procedures (including critical care time)  Medications Ordered in ED Medications - No data to display   Initial Impression / Assessment and Plan / ED Course  I have reviewed the triage vital signs and the nursing notes.  Pertinent labs & imaging results that were available during my care of the patient were reviewed by me and considered in my medical decision making (see chart for details).     MDM Reviewed: nursing note, previous chart and  vitals Reviewed previous: labs and ECG Interpretation: labs, ECG and x-ray   Results for orders placed or performed during the hospital encounter of 05/26/19  SARS Coronavirus 2 [facility](Hospital order, Performed in [facility] hospital lab) Nasopharyngeal Nasopharyngeal Swab   Specimen: Nasopharyngeal Swab  Result Value Ref Range   SARS Coronavirus 2 POSITIVE (A) NEGATIVE  Basic metabolic panel  Result Value Ref Range   Sodium 140 135 - 145 mmol/L   Potassium 3.4 (L) 3.5 - 5.1 mmol/L   Chloride 106 98 - 111 mmol/L   CO2 23 22 - 32 mmol/L   Glucose, Bld 106 (H) 70 - 99 mg/dL   BUN 8 8 - 23 mg/dL   Creatinine, Ser 7.820.84 0.44 - 1.00 mg/dL   Calcium 8.8 (L) 8.9 - 10.3 mg/dL   GFR calc non Af Amer >60 >60 mL/min   GFR calc Af Amer >60 >60 mL/min   Anion gap 11 5 - 15  CBC with Differential  Result Value Ref Range   WBC 6.8 4.0 - 10.5 K/uL   RBC 4.69 3.87 - 5.11 MIL/uL   Hemoglobin 12.9 12.0 - 15.0 g/dL   HCT 95.640.6 21.336.0 - 08.646.0 %   MCV 86.6 80.0 - 100.0 fL   MCH 27.5 26.0 - 34.0 pg   MCHC 31.8 30.0 - 36.0 g/dL   RDW 57.813.4 46.911.5 - 62.915.5 %   Platelets 191 150 - 400 K/uL   nRBC 0.0 0.0 - 0.2 %    Neutrophils Relative % 77 %   Neutro Abs 5.2 1.7 - 7.7 K/uL   Lymphocytes Relative 18 %   Lymphs Abs 1.3 0.7 - 4.0 K/uL   Monocytes Relative 5 %   Monocytes Absolute 0.3 0.1 - 1.0 K/uL   Eosinophils Relative 0 %   Eosinophils Absolute 0.0 0.0 - 0.5 K/uL   Basophils Relative 0 %   Basophils Absolute 0.0 0.0 - 0.1 K/uL   Immature Granulocytes 0 %   Abs Immature Granulocytes 0.02 0.00 - 0.07 K/uL  Lactic acid, plasma  Result Value Ref Range   Lactic Acid, Venous 1.2 0.5 - 1.9 mmol/L  Lactic acid, plasma  Result Value Ref Range   Lactic Acid, Venous 1.2 0.5 - 1.9 mmol/L  Blood gas, arterial  Result Value Ref Range   FIO2 21.00    pH, Arterial 7.447 7.350 - 7.450   pCO2 arterial 33.5 32.0 - 48.0 mmHg   pO2, Arterial 75.5 (L) 83.0 - 108.0 mmHg   Bicarbonate 24.2 20.0 - 28.0 mmol/L   Acid-base deficit 0.8 0.0 - 2.0 mmol/L   O2 Saturation 94.8 %   Patient temperature 37.2    Allens test (pass/fail) BRACHIAL ARTERY (A) PASS  Troponin I (High Sensitivity)  Result Value Ref Range   Troponin I (High Sensitivity) 6 <18 ng/L  Troponin I (High Sensitivity)  Result Value Ref Range   Troponin I (High Sensitivity) 6 <18 ng/L   Dg Chest Port 1 View Result Date: 05/26/2019 CLINICAL DATA:  63 year old female with COVID-19, shortness of breath EXAM: PORTABLE CHEST 1 VIEW COMPARISON:  Prior chest x-ray 05/23/2019 FINDINGS: Stable cardiomegaly. The mediastinal contours remain within normal limits. Slightly increased patchy bilateral airspace opacities most notably in the right mid lung. No pneumothorax or pleural effusion. No acute osseous abnormality. IMPRESSION: 1. Slight interval progression of bilateral patchy airspace opacities, particularly in the right mid lung. Findings concerning for progression of viral pneumonia. 2. Stable cardiomegaly. Electronically Signed   By: Isac CaddyHeath  McCullough M.D.  On: 05/26/2019 09:30    Tanya Boyd was evaluated in Emergency Department on 05/26/2019 for the  symptoms described in the history of present illness. She was evaluated in the context of the global COVID-19 pandemic, which necessitated consideration that the patient might be at risk for infection with the SARS-CoV-2 virus that causes COVID-19. Institutional protocols and algorithms that pertain to the evaluation of patients at risk for COVID-19 are in a state of rapid change based on information released by regulatory bodies including the CDC and federal and state organizations. These policies and algorithms were followed during the patient's care in the ED.    1205:  Pt has ambulated with Sats 97% R/A. Pt does c/o SOB. ABG with low PO2; will admit. Potassium repleted PO.  T/C returned from [facility]. Arbutus Leasat, case discussed, including:  HPI, pertinent PM/SHx, VS/PE, dx testing, ED course and treatment:  Agreeable to admit.     Final Clinical Impressions(s) / ED Diagnoses   Final diagnoses:  None    ED Discharge Orders    None       Samuel JesterMcManus, Rital Cavey, DO 05/30/19 1531

## 2019-05-26 NOTE — ED Notes (Signed)
Pt ambulated without difficulty with O2 sat 97% on room air.

## 2019-05-26 NOTE — ED Notes (Signed)
Report to [facility]

## 2019-05-26 NOTE — ED Notes (Signed)
Bed assigned  Family notified  Awaiting [facility] transport

## 2019-05-26 NOTE — ED Triage Notes (Signed)
Pt reports has covid, c/o sob since last night.  Worse with laying down.  Denies abd pain.

## 2019-05-26 NOTE — ED Notes (Signed)
Call to daughter   Tanya Boyd to update  [phone]

## 2019-05-26 NOTE — H&P (Signed)
History and Physical  Tanya Boyd RDE:[id number] DOB: 04-Dec-1955 DOA: 05/26/2019   PCP: Melvyn Neth, NP   Patient coming from: Home  Chief Complaint: sob  HPI:  Tanya Boyd is a 63 y.o. female with medical history of asthma, hypertension, diabetes mellitus type 2 presenting with 1 day history of shortness of breath that began on 05/25/2019.  The patient had complained of approximately 4-day history of myalgias, arthralgias, nausea, vomiting, and cough prior to going to urgent care in [place] on 05/19/2019.  The patient was tested for COVID, and subsequently was contacted on 05/21/2019 to tell her that she was COVID positive.  The patient began having worse nausea, vomiting, and diarrhea.  She had abdominal cramping.  She came to the emergency department at any pain hospital on 05/23/2019.  She was treated symptomatically and discharged home with appropriate instructions for isolation.  However, the patient stated that she continued to have some fevers up to 102.0 F, last time on 05/24/2019.  Because of shortness of breath that began on 05/25/2019 she presented for further evaluation.  She states that her vomiting and diarrhea and abdominal cramping have improved.  She denies any headache, neck pain, hemoptysis, abdominal pain, hematuria, hematochezia, melena.  She is not having any chest pain at this time. In the emergency department, the patient had a low-grade temperature of 99.1 F.  She was hemodynamically stable with oxygen saturation 96-97% on room air.  BMP showed a potassium 3.4, but was otherwise unremarkable.  Assessment/Plan: Acute respiratory disease secondary to COVID-19 -Presently stable on room air with oxygen saturation 96-97% -Repeat SARS-CoV2 NAA -Obtain CRP -Ferritin -LDH -Hepatic function panel -Procalcitonin -D-dimer -Start dexamethasone  Diabetes mellitus type 2 -Holding metformin -NovoLog sliding scale -Anticipate elevated CBGs secondary to  steroids  -Hemoglobin A1c  Essential hypertension -Restart amlodipine -Holding losartan secondary to soft blood pressure  Hypokalemia -Replete -Check magnesium       Past Medical History:  Diagnosis Date  . Asthma   . Diabetes mellitus without complication (HCC)   . Hypertension    Past Surgical History:  Procedure Laterality Date  . ABDOMINAL HYSTERECTOMY    . arm surgery     Social History:  reports that she has never smoked. She has never used smokeless tobacco. She reports that she does not drink alcohol or use drugs.   FAmily history reviewed--no pertinent family history  Allergies  Allergen Reactions  . Lisinopril   . Penicillins     Did it involve swelling of the face/tongue/throat, SOB, or low BP? Unknown Did it involve sudden or severe rash/hives, skin peeling, or any reaction on the inside of your mouth or nose? Unknown Did you need to seek medical attention at a hospital or doctor's office? Unknown When did it last happen? If all above answers are "NO", may proceed with cephalosporin use.      Prior to Admission medications   Medication Sig Start Date End Date Taking? Authorizing Provider  albuterol (VENTOLIN HFA) 108 (90 Base) MCG/ACT inhaler Inhale 1-2 puffs into the lungs every 6 (six) hours as needed for wheezing or shortness of breath.   Yes [provider]  amLODipine (NORVASC) 5 MG tablet Take 5 mg by mouth daily. 04/25/19  Yes [provider]  aspirin 81 MG EC tablet Take 81 mg by mouth daily.    Yes [provider]  dicyclomine (BENTYL) 20 MG tablet Take 1 tablet (20 mg total) by mouth 2 (two) times daily.  05/23/19  Yes Long, Wonda Olds, MD  loperamide (IMODIUM) 2 MG capsule Take 1 capsule (2 mg total) by mouth 4 (four) times daily as needed for diarrhea or loose stools. 05/23/19  Yes Long, Wonda Olds, MD  losartan (COZAAR) 25 MG tablet Take 25 mg by mouth daily. 03/12/19  Yes [provider]  metFORMIN  (GLUCOPHAGE) 500 MG tablet Take 500 mg by mouth daily with breakfast.    Yes [provider]  omeprazole (PRILOSEC) 20 MG capsule Take 20 mg by mouth daily. 05/21/19  Yes [provider]  ondansetron (ZOFRAN) 4 MG tablet TAKE 1 TABLET BY MOUTH EVERY 8 HOURS AS NEEDED FOR NAUSEA AND VOMITING 05/19/19  Yes [provider]  potassium chloride SA (K-DUR) 20 MEQ tablet Take 1 tablet (20 mEq total) by mouth 2 (two) times daily for 5 days. 05/23/19 05/28/19 Yes Long, Wonda Olds, MD  SYMBICORT 160-4.5 MCG/ACT inhaler Inhale 2 puffs into the lungs 2 (two) times daily. 01/19/19  Yes [provider]  valACYclovir (VALTREX) 500 MG tablet TAKE 1 CAPLET BY MOUTH ONCE DAILY 03/30/19  Yes [provider]    Review of Systems:  Constitutional:  No weight loss, night sweats,  Head&Eyes: No headache.  No vision loss.  No eye pain or scotoma ENT:  No Difficulty swallowing,Tooth/dental problems,Sore throat,  No ear ache, post nasal drip,  Cardio-vascular:  No chest pain, Orthopnea, PND, swelling in lower extremities,  dizziness, palpitations  GI:  No  abdominal pain, nausea, vomiting, diarrhea, loss of appetite, hematochezia, melena, heartburn, indigestion, Resp:  No cough. No coughing up of blood .No wheezing.No chest wall deformity  Skin:  no rash or lesions.  GU:  no dysuria, change in color of urine, no urgency or frequency. No flank pain.  Musculoskeletal:  No joint pain or swelling. No decreased range of motion. No back pain.  Psych:  No change in mood or affect. No depression or anxiety. Neurologic: No headache, no dysesthesia, no focal weakness, no vision loss. No syncope  Physical Exam: Vitals:   05/26/19 1130 05/26/19 1230 05/26/19 1245 05/26/19 1300  BP: 134/89   120/63  Pulse: 71 69 69   Resp: 13 19 14 19   Temp:      TempSrc:      SpO2: 97% 95% 100%   Weight:      Height:       General:  A&O x 3, NAD, nontoxic, pleasant/cooperative Head/Eye: No  conjunctival hemorrhage, no icterus, [place]/AT, No nystagmus ENT:  No icterus,  No thrush, good dentition, no pharyngeal exudate Neck:  No masses, no lymphadenpathy, no bruits CV:  RRR, no rub, no gallop, no S3 Lung:  Bibasilar crackles, R>L, no wheeze Abdomen: soft/NT, +BS, nondistended, no peritoneal signs Ext: No cyanosis, No rashes, No petechiae, No lymphangitis, No edema Neuro: CNII-XII intact, strength 4/5 in bilateral upper and lower extremities, no dysmetria  Labs on Admission:  Basic Metabolic Panel: Recent Labs  Lab 05/23/19 1007 05/26/19 0923  NA 138 140  K 3.3* 3.4*  CL 103 106  CO2 25 23  GLUCOSE 112* 106*  BUN 8 8  CREATININE 0.86 0.84  CALCIUM 8.6* 8.8*   Liver Function Tests: Recent Labs  Lab 05/23/19 1007  AST 36  ALT 39  ALKPHOS 64  BILITOT 1.0  PROT 7.4  ALBUMIN 3.8   Recent Labs  Lab 05/23/19 1007  LIPASE 39   No results for input(s): AMMONIA in the last 168 hours. CBC: Recent Labs  Lab 05/23/19 1007 05/26/19 0923  WBC 5.4 6.8  NEUTROABS 3.9 5.2  HGB 13.2 12.9  HCT 40.8 40.6  MCV 85.9 86.6  PLT 172 191   Coagulation Profile: No results for input(s): INR, PROTIME in the last 168 hours. Cardiac Enzymes: No results for input(s): CKTOTAL, CKMB, CKMBINDEX, TROPONINI in the last 168 hours. BNP: Invalid input(s): POCBNP CBG: No results for input(s): GLUCAP in the last 168 hours. Urine analysis:    Component Value Date/Time   COLORURINE YELLOW 05/23/2019 0833   APPEARANCEUR CLEAR 05/23/2019 0833   LABSPEC 1.008 05/23/2019 0833   PHURINE 6.0 05/23/2019 0833   GLUCOSEU NEGATIVE 05/23/2019 0833   HGBUR SMALL (A) 05/23/2019 0833   BILIRUBINUR NEGATIVE 05/23/2019 0833   KETONESUR NEGATIVE 05/23/2019 0833   PROTEINUR NEGATIVE 05/23/2019 0833   NITRITE NEGATIVE 05/23/2019 0833   LEUKOCYTESUR NEGATIVE 05/23/2019 0833   Sepsis Labs: @LABRCNTIP (procalcitonin:4,lacticidven:4) )No results found for this or any previous visit (from the past 240  hour(s)).   Radiological Exams on Admission: Dg Chest Port 1 View  Result Date: 05/26/2019 CLINICAL DATA:  63 year old female with COVID-19, shortness of breath EXAM: PORTABLE CHEST 1 VIEW COMPARISON:  Prior chest x-ray 05/23/2019 FINDINGS: Stable cardiomegaly. The mediastinal contours remain within normal limits. Slightly increased patchy bilateral airspace opacities most notably in the right mid lung. No pneumothorax or pleural effusion. No acute osseous abnormality. IMPRESSION: 1. Slight interval progression of bilateral patchy airspace opacities, particularly in the right mid lung. Findings concerning for progression of viral pneumonia. 2. Stable cardiomegaly. Electronically Signed   By: Malachy MoanHeath  McCullough M.D.   On: 05/26/2019 09:30        Time spent:60 minutes Code Status:   FULL Family Communication:  Son updated on phone Disposition Plan: Transfer to [facility] Consults called: none DVT Prophylaxis: [place] Lovenox  Catarina HartshornDavid Jaqwon Manfred, DO  [facility] Pager [phone]  If 7PM-7AM, please contact night-coverage [email or web address] Password [email or web address] 05/26/2019, 1:05 PM

## 2019-05-26 NOTE — ED Notes (Signed)
Discussed with daughter   Benjaman Kindler [phone]  She reports her father is also admitted for Covid at [facility]   Reports her mother has had for the last 10 days and would appreciate a call when her mother arrives at [facility]

## 2019-05-26 NOTE — ED Notes (Signed)
Call to

## 2019-05-26 NOTE — ED Notes (Signed)
Lab in to draw

## 2019-05-27 DIAGNOSIS — J45901 Unspecified asthma with (acute) exacerbation: Secondary | ICD-10-CM

## 2019-05-27 DIAGNOSIS — R0602 Shortness of breath: Secondary | ICD-10-CM

## 2019-05-27 LAB — CBC WITH DIFFERENTIAL/PLATELET
Abs Immature Granulocytes: 0.02 10*3/uL (ref 0.00–0.07)
Basophils Absolute: 0 10*3/uL (ref 0.0–0.1)
Basophils Relative: 0 %
Eosinophils Absolute: 0 10*3/uL (ref 0.0–0.5)
Eosinophils Relative: 0 %
HCT: 39.2 % (ref 36.0–46.0)
Hemoglobin: 12.7 g/dL (ref 12.0–15.0)
Immature Granulocytes: 0 %
Lymphocytes Relative: 18 %
Lymphs Abs: 0.8 10*3/uL (ref 0.7–4.0)
MCH: 27.2 pg (ref 26.0–34.0)
MCHC: 32.4 g/dL (ref 30.0–36.0)
MCV: 83.9 fL (ref 80.0–100.0)
Monocytes Absolute: 0.2 10*3/uL (ref 0.1–1.0)
Monocytes Relative: 4 %
Neutro Abs: 3.6 10*3/uL (ref 1.7–7.7)
Neutrophils Relative %: 78 %
Platelets: 211 10*3/uL (ref 150–400)
RBC: 4.67 MIL/uL (ref 3.87–5.11)
RDW: 13.2 % (ref 11.5–15.5)
WBC: 4.6 10*3/uL (ref 4.0–10.5)
nRBC: 0 % (ref 0.0–0.2)

## 2019-05-27 LAB — COMPREHENSIVE METABOLIC PANEL
ALT: 28 U/L (ref 0–44)
AST: 27 U/L (ref 15–41)
Albumin: 3.4 g/dL — ABNORMAL LOW (ref 3.5–5.0)
Alkaline Phosphatase: 64 U/L (ref 38–126)
Anion gap: 11 (ref 5–15)
BUN: 11 mg/dL (ref 8–23)
CO2: 22 mmol/L (ref 22–32)
Calcium: 9.3 mg/dL (ref 8.9–10.3)
Chloride: 106 mmol/L (ref 98–111)
Creatinine, Ser: 0.87 mg/dL (ref 0.44–1.00)
GFR calc Af Amer: >60 mL/min (ref >60)
GFR calc non Af Amer: >60 mL/min (ref >60)
Glucose, Bld: 199 mg/dL — ABNORMAL HIGH (ref 70–99)
Potassium: 3.9 mmol/L (ref 3.5–5.1)
Sodium: 139 mmol/L (ref 135–145)
Total Bilirubin: 0.7 mg/dL (ref 0.3–1.2)
Total Protein: 7.5 g/dL (ref 6.5–8.1)

## 2019-05-27 LAB — HEMOGLOBIN A1C
Hgb A1c MFr Bld: 6 % — ABNORMAL HIGH (ref 4.8–5.6)
Mean Plasma Glucose: 125.5 mg/dL

## 2019-05-27 LAB — PHOSPHORUS: Phosphorus: 1.7 mg/dL — ABNORMAL LOW (ref 2.5–4.6)

## 2019-05-27 LAB — GLUCOSE, CAPILLARY
Glucose-Capillary: 179 mg/dL — ABNORMAL HIGH (ref 70–99)
Glucose-Capillary: 162 mg/dL — ABNORMAL HIGH (ref 70–99)
Glucose-Capillary: 134 mg/dL — ABNORMAL HIGH (ref 70–99)
Glucose-Capillary: 163 mg/dL — ABNORMAL HIGH (ref 70–99)

## 2019-05-27 LAB — C-REACTIVE PROTEIN: CRP: 5 mg/dL — ABNORMAL HIGH (ref <1.0)

## 2019-05-27 LAB — FERRITIN: Ferritin: 417 ng/mL — ABNORMAL HIGH (ref 11–307)

## 2019-05-27 LAB — D-DIMER, QUANTITATIVE: D-Dimer, Quant: 0.39 ug/mL-FEU (ref 0.00–0.50)

## 2019-05-27 LAB — HIV ANTIBODY (ROUTINE TESTING W REFLEX): HIV Screen 4th Generation wRfx: NONREACTIVE

## 2019-05-27 LAB — ABO/RH: ABO/RH(D): O POS

## 2019-05-27 LAB — MAGNESIUM: Magnesium: 1.9 mg/dL (ref 1.7–2.4)

## 2019-05-27 MED ORDER — POTASSIUM PHOSPHATES 15 MMOLE/5ML IV SOLN
20.0000 mmol | Freq: Once | INTRAVENOUS | Status: AC
  Administered 2019-05-27: 20 mmol via INTRAVENOUS
  Filled 2019-05-27: qty 6.67

## 2019-05-27 MED ORDER — METHYLPREDNISOLONE SODIUM SUCC 40 MG IJ SOLR
40.0000 mg | Freq: Three times a day (TID) | INTRAMUSCULAR | Status: DC
  Administered 2019-05-27 – 2019-05-28 (×4): 40 mg via INTRAVENOUS
  Filled 2019-05-27 (×4): qty 1

## 2019-05-27 NOTE — Plan of Care (Signed)

## 2019-05-27 NOTE — Plan of Care (Signed)
Pt is progressing 

## 2019-05-27 NOTE — Progress Notes (Signed)
Called pt's son to update him on his mothers plan of care.  He appreciated the update.  

## 2019-05-27 NOTE — Progress Notes (Signed)
PROGRESS NOTE                                                                                                                                                                                                             Patient Demographics:    Tanya Boyd, is a 63 y.o. female, DOB - May 16, 1956, [id number]  Admit date - 05/26/2019   Admitting Physician Orson Eva, MD  Outpatient Primary MD for the patient is Margy Clarks, NP  LOS - 1   Chief Complaint  Patient presents with  . Shortness of Breath  . covid       Brief Narrative   63 y.o. female with medical history of asthma, hypertension, diabetes mellitus type 2 presenting with 1 day history of shortness of breath that began on 05/25/2019, patient tested positive for COVID-19 on 8/23, he felt symptoms were nausea, vomiting and diarrhea, she developed dyspnea 1 day prior to presentation to ED, she was transferred to [facility] for further care.    Subjective:    [name] today reports some cough, dry, still reports some dyspnea, mainly exertional, reports generalized weakness as well .    Assessment  & Plan :    Active Problems:   Acute respiratory disease due to COVID-19 virus   Essential hypertension   Hypokalemia  COVID-19 infection -Chest x-ray with worsening bilateral opacities, CRP are elevated at 5, ferritin elevated at 417, but she is with no hypoxia, so no indication for Remdesivir or Actemra. -Continue to trend inflammatory markers closely. -Continue with zinc and vitamin C. -He is on steroids Decadron, I will change to IV Solu-Medrol given some wheezing today.  Especially patient is in high risk for decompensation given her history of asthma. -Continue with Protonix for GI prophylaxis, continue with subcu Lovenox for DVT prophylaxis  COVID-19 Labs  Recent Labs    05/26/19 1520 05/26/19 1521 05/27/19 0240  DDIMER  --  0.46 0.39  FERRITIN 400*  --   417*  LDH  --  208*  --   CRP 6.0*  --  5.0*    Lab Results  Component Value Date   SARSCOV2NAA POSITIVE (A) 05/26/2019   Mild asthma exacerbation -Patient with known history of asthma, usually she has wheezing after respiratory illness, she has some wheezing today, I will change her Decadron to  IV Solu-Medrol 40 mg IV every 8 hours.  Continue to monitor her closely, especially she is asthma patient with no COVID-19, at high risk for decompensation, she will be monitored tonight on IV steroids.  Diabetes mellitus type II -Continue to hold metformin, continue with insulin sliding scale during hospital stay   Essential hypertension -Pressure remains soft, I will discontinue her lisinopril and amlodipine  Hypokalemia - Repleted  Code Status : full  Family Communication  : D/W patient  Disposition Plan  : Home when stable  Barriers For Discharge : Patient remains on IV steroids, will need close monitoring, and at high risk for decompensation given she is asthma patient, with COVID-19 infection, and elevated inflammatory markers.  Consults  :  None  Procedures  : None  DVT Prophylaxis  : Subcu Lovenox  Lab Results  Component Value Date   PLT 211 05/27/2019    Antibiotics  :    Anti-infectives (From admission, onward)   Start     Dose/Rate Route Frequency Ordered Stop   05/27/19 1000  valACYclovir (VALTREX) tablet 500 mg     500 mg Oral Daily 05/26/19 2203          Objective:   Vitals:   05/26/19 1900 05/26/19 2000 05/27/19 0445 05/27/19 0805  BP: 110/78 126/86 116/70 108/68  Pulse: 72 74 64 66  Resp: (!) 30 18 18 18   Temp:  97.9 F (36.6 C) 98.4 F (36.9 C)   TempSrc:   Oral   SpO2: 94% 98% 95% 99%  Weight:      Height:        Wt Readings from Last 3 Encounters:  05/26/19 102 kg  05/23/19 102.5 kg    No intake or output data in the 24 hours ending 05/27/19 1104   Physical Exam  Awake Alert, Oriented X 3, No new F.N deficits, Normal affect  Symmetrical Chest wall movement, Good air movement bilaterally, she has bilateral wheezing, able to speak full sentences .RRR,No Gallops,Rubs or new Murmurs, No Parasternal Heave +ve B.Sounds, Abd Soft, No tenderness,  No rebound - guarding or rigidity. No Cyanosis, Clubbing or edema, No new Rash or bruise      Data Review:    CBC Recent Labs  Lab 05/23/19 1007 05/26/19 0923 05/27/19 0240  WBC 5.4 6.8 4.6  HGB 13.2 12.9 12.7  HCT 40.8 40.6 39.2  PLT 172 191 211  MCV 85.9 86.6 83.9  MCH 27.8 27.5 27.2  MCHC 32.4 31.8 32.4  RDW 13.2 13.4 13.2  LYMPHSABS 1.1 1.3 0.8  MONOABS 0.4 0.3 0.2  EOSABS 0.0 0.0 0.0  BASOSABS 0.0 0.0 0.0    Chemistries  Recent Labs  Lab 05/23/19 1007 05/26/19 0923 05/26/19 1521 05/26/19 1522 05/27/19 0240  NA 138 140  --   --  139  K 3.3* 3.4*  --   --  3.9  CL 103 106  --   --  106  CO2 25 23  --   --  22  GLUCOSE 112* 106*  --   --  199*  BUN 8 8  --   --  11  CREATININE 0.86 0.84  --   --  0.87  CALCIUM 8.6* 8.8*  --   --  9.3  MG  --   --  1.9  --  1.9  AST 36  --   --  28 27  ALT 39  --   --  26 28  ALKPHOS 64  --   --  62 64  BILITOT 1.0  --   --  1.0 0.7   ------------------------------------------------------------------------------------------------------------------ No results for input(s): CHOL, HDL, LDLCALC, TRIG, CHOLHDL, LDLDIRECT in the last 72 hours.  No results found for: HGBA1C ------------------------------------------------------------------------------------------------------------------ No results for input(s): TSH, T4TOTAL, T3FREE, THYROIDAB in the last 72 hours.  Invalid input(s): FREET3 ------------------------------------------------------------------------------------------------------------------ Recent Labs    05/26/19 1520 05/27/19 0240  FERRITIN 400* 417*    Coagulation profile No results for input(s): INR, PROTIME in the last 168 hours.  Recent Labs    05/26/19 1521 05/27/19 0240  DDIMER  0.46 0.39    Cardiac Enzymes No results for input(s): CKMB, TROPONINI, MYOGLOBIN in the last 168 hours.  Invalid input(s): CK ------------------------------------------------------------------------------------------------------------------ No results found for: BNP  Inpatient Medications  Scheduled Meds: . amLODipine  5 mg Oral Daily  . aspirin  81 mg Oral Daily  . dexamethasone (DECADRON) injection  6 mg Intravenous Q24H  . dicyclomine  20 mg Oral BID  . enoxaparin (LOVENOX) injection  50 mg Subcutaneous Q24H  . insulin aspart  0-15 Units Subcutaneous TID WC  . insulin aspart  0-5 Units Subcutaneous QHS  . losartan  25 mg Oral Daily  . mometasone-formoterol  2 puff Inhalation BID  . pantoprazole  40 mg Oral Daily  . valACYclovir  500 mg Oral Daily   Continuous Infusions: PRN Meds:.albuterol, chlorpheniramine-HYDROcodone, guaiFENesin-dextromethorphan, ondansetron **OR** ondansetron (ZOFRAN) IV  Micro Results Recent Results (from the past 240 hour(s))  Culture, blood (Routine X 2) w Reflex to ID Panel     Status: None (Preliminary result)   Collection Time: 05/26/19 11:54 AM   Specimen: BLOOD  Result Value Ref Range Status   Specimen Description BLOOD  Final   Special Requests NONE  Final   Culture   Final    NO GROWTH < 24 HOURS Performed at [facility], [address] [place], [place] [address]    Report Status PENDING  Incomplete  SARS Coronavirus 2 [facility](Hospital order, Performed in [facility] hospital lab) Nasopharyngeal Nasopharyngeal Swab     Status: Abnormal   Collection Time: 05/26/19  1:00 PM   Specimen: Nasopharyngeal Swab  Result Value Ref Range Status   SARS Coronavirus 2 POSITIVE (A) NEGATIVE Final    Comment: RESULT CALLED TO, READ BACK BY AND VERIFIED WITH: TUTTLE. A. AT 1435 ON 05/26/2019 BY EVA (NOTE) If result is NEGATIVE SARS-CoV-2 target nucleic acids are NOT DETECTED. The SARS-CoV-2 RNA is generally detectable in upper and lower  respiratory  specimens during the acute phase of infection. The lowest  concentration of SARS-CoV-2 viral copies this assay can detect is 250  copies / mL. A negative result does not preclude SARS-CoV-2 infection  and should not be used as the sole basis for treatment or other  patient management decisions.  A negative result may occur with  improper specimen collection / handling, submission of specimen other  than nasopharyngeal swab, presence of viral mutation(s) within the  areas targeted by this assay, and inadequate number of viral copies  (<250 copies / mL). A negative result must be combined with clinical  observations, patient history, and epidemiological information. If result is POSITIVE SARS-CoV-2 target nucleic acids are DETECTED . The SARS-CoV-2 RNA is generally detectable in upper and lower  respiratory specimens during the acute phase of infection.  Positive  results are indicative of active infection with SARS-CoV-2.  Clinical  correlation with patient history and other diagnostic information is  necessary to determine patient infection status.  Positive  results do  not rule out bacterial infection or co-infection with other viruses. If result is PRESUMPTIVE POSTIVE SARS-CoV-2 nucleic acids MAY BE PRESENT.   A presumptive positive result was obtained on the submitted specimen  and confirmed on repeat testing.  While 2019 novel coronavirus  (SARS-CoV-2) nucleic acids may be present in the submitted sample  additional confirmatory testing may be necessary for epidemiological  and / or clinical management purposes  to differentiate between  SARS-CoV-2 and other Sarbecovirus currently known to infect humans.  If clinically indicated additional testing with an alternate test  methodology [phone] ) is advised. The SARS-CoV-2 RNA is generally  detectable in upper and lower respiratory specimens during the acute  phase of infection. The expected result is Negative. Fact Sheet for  Patients:  BoilerBrush.com.cy Fact Sheet for Healthcare Providers: https://pope.com/ This test is not yet approved or cleared by the [place] FDA and has been authorized for detection and/or diagnosis of SARS-CoV-2 by FDA under an Emergency Use Authorization (EUA).  This EUA will remain in effect (meaning this test can be used) for the duration of the COVID-19 declaration under Section 564(b)(1) of the Act, 21 U.S.C. section 360bbb-3(b)(1), unless the authorization is terminated or revoked sooner. Performed at [facility], [address] [place], [place] [address]   Culture, blood (Routine X 2) w Reflex to ID Panel     Status: None (Preliminary result)   Collection Time: 05/26/19  3:21 PM   Specimen: BLOOD RIGHT FOREARM  Result Value Ref Range Status   Specimen Description BLOOD RIGHT FOREARM BOTTLES DRAWN AEROBIC ONLY  Final   Special Requests Blood Culture adequate volume  Final   Culture   Final    NO GROWTH < 24 HOURS Performed at [facility], [address], [place], [place] [address]    Report Status PENDING  Incomplete    Radiology Reports Dg Chest Port 1 View  Result Date: 05/26/2019 CLINICAL DATA:  63 year old female with COVID-19, shortness of breath EXAM: PORTABLE CHEST 1 VIEW COMPARISON:  Prior chest x-ray 05/23/2019 FINDINGS: Stable cardiomegaly. The mediastinal contours remain within normal limits. Slightly increased patchy bilateral airspace opacities most notably in the right mid lung. No pneumothorax or pleural effusion. No acute osseous abnormality. IMPRESSION: 1. Slight interval progression of bilateral patchy airspace opacities, particularly in the right mid lung. Findings concerning for progression of viral pneumonia. 2. Stable cardiomegaly. Electronically Signed   By: Malachy Moan M.D.   On: 05/26/2019 09:30   Dg Chest Portable 1 View  Result Date: 05/23/2019 CLINICAL DATA:  COVID-19, cough, abdominal  pain, vomiting EXAM: PORTABLE CHEST 1 VIEW COMPARISON:  None. FINDINGS: Cardiomegaly. There is subtle bilateral heterogeneous opacity, most conspicuous in the peripheral mid lungs. Overlying soft tissue may exaggerate this appearance. The visualized skeletal structures are unremarkable. IMPRESSION: Cardiomegaly. There is subtle bilateral heterogeneous opacity, most conspicuous in the peripheral mid lungs, generally in keeping with reported COVID-19 diagnosis. Overlying soft tissue may exaggerate this appearance. Electronically Signed   By: Lauralyn Primes M.D.   On: 05/23/2019 10:56      Huey Bienenstock M.D on 05/27/2019 at 11:04 AM  Between 7am to 7pm - Pager - [phone]  After 7pm go to [email or web address] - password [facility]  [facility] -  Office  [phone]

## 2019-05-28 LAB — COMPREHENSIVE METABOLIC PANEL
ALT: 31 U/L (ref 0–44)
AST: 32 U/L (ref 15–41)
Albumin: 3.4 g/dL — ABNORMAL LOW (ref 3.5–5.0)
Alkaline Phosphatase: 62 U/L (ref 38–126)
Anion gap: 13 (ref 5–15)
BUN: 15 mg/dL (ref 8–23)
CO2: 22 mmol/L (ref 22–32)
Calcium: 9.3 mg/dL (ref 8.9–10.3)
Chloride: 106 mmol/L (ref 98–111)
Creatinine, Ser: 1.04 mg/dL — ABNORMAL HIGH (ref 0.44–1.00)
GFR calc Af Amer: >60 mL/min (ref >60)
GFR calc non Af Amer: 57 mL/min — ABNORMAL LOW (ref >60)
Glucose, Bld: 128 mg/dL — ABNORMAL HIGH (ref 70–99)
Potassium: 4.4 mmol/L (ref 3.5–5.1)
Sodium: 141 mmol/L (ref 135–145)
Total Bilirubin: 0.5 mg/dL (ref 0.3–1.2)
Total Protein: 7.2 g/dL (ref 6.5–8.1)

## 2019-05-28 LAB — CBC WITH DIFFERENTIAL/PLATELET
Abs Immature Granulocytes: 0.11 10*3/uL — ABNORMAL HIGH (ref 0.00–0.07)
Basophils Absolute: 0 10*3/uL (ref 0.0–0.1)
Basophils Relative: 0 %
Eosinophils Absolute: 0 10*3/uL (ref 0.0–0.5)
Eosinophils Relative: 0 %
HCT: 38.7 % (ref 36.0–46.0)
Hemoglobin: 12.6 g/dL (ref 12.0–15.0)
Immature Granulocytes: 1 %
Lymphocytes Relative: 8 %
Lymphs Abs: 1.1 10*3/uL (ref 0.7–4.0)
MCH: 27.4 pg (ref 26.0–34.0)
MCHC: 32.6 g/dL (ref 30.0–36.0)
MCV: 84.1 fL (ref 80.0–100.0)
Monocytes Absolute: 0.4 10*3/uL (ref 0.1–1.0)
Monocytes Relative: 3 %
Neutro Abs: 11.7 10*3/uL — ABNORMAL HIGH (ref 1.7–7.7)
Neutrophils Relative %: 88 %
Platelets: 244 10*3/uL (ref 150–400)
RBC: 4.6 MIL/uL (ref 3.87–5.11)
RDW: 13.3 % (ref 11.5–15.5)
WBC: 13.4 10*3/uL — ABNORMAL HIGH (ref 4.0–10.5)
nRBC: 0 % (ref 0.0–0.2)

## 2019-05-28 LAB — D-DIMER, QUANTITATIVE: D-Dimer, Quant: 0.29 ug/mL-FEU (ref 0.00–0.50)

## 2019-05-28 LAB — C-REACTIVE PROTEIN: CRP: 1.2 mg/dL — ABNORMAL HIGH (ref <1.0)

## 2019-05-28 LAB — GLUCOSE, CAPILLARY
Glucose-Capillary: 135 mg/dL — ABNORMAL HIGH (ref 70–99)
Glucose-Capillary: 172 mg/dL — ABNORMAL HIGH (ref 70–99)

## 2019-05-28 LAB — MAGNESIUM: Magnesium: 2.1 mg/dL (ref 1.7–2.4)

## 2019-05-28 LAB — PHOSPHORUS: Phosphorus: 4.6 mg/dL (ref 2.5–4.6)

## 2019-05-28 LAB — FERRITIN: Ferritin: 494 ng/mL — ABNORMAL HIGH (ref 11–307)

## 2019-05-28 MED ORDER — PREDNISONE 5 MG (21) PO TBPK: ORAL_TABLET | ORAL | 0 refills | Status: AC

## 2019-05-28 NOTE — TOC Transition Note (Signed)
Transition of Care [facility]) - CM/SW Discharge Note   Patient Details  Name: Tanya Boyd MRN: [id number] Date of Birth: October 22, 1955  Transition of Care [facility]) CM/SW Contact:  Joaquin Courts, RN Phone Number: 05/28/2019, 11:35 AM   Clinical Narrative:    Chart reviewed for discharge. No needs identified. CM signing off.    Final next level of care: Home/Self Care Barriers to Discharge: No Barriers Identified   Patient Goals and CMS Choice Patient states their goals for this hospitalization and ongoing recovery are:: to go home      Discharge Placement                       Discharge Plan and Services   Discharge Planning Services: CM Consult            DME Arranged: N/A DME Agency: NA       HH Arranged: NA HH Agency: NA        Social Determinants of Health (SDOH) Interventions     Readmission Risk Interventions No flowsheet data found.

## 2019-05-28 NOTE — Discharge Summary (Signed)
Tanya Boyd, is a 63 y.o. female  DOB 07-Jul-1956  MRN [id number].  Admission date:  05/26/2019  Admitting Physician  Catarina Hartshorn, MD  Discharge Date:  05/28/2019   Primary MD  Melvyn Neth, NP  Recommendations for primary care physician for things to follow:  - please check CBC, CMP in 10 days.   Admission Diagnosis  Shortness of breath [R06.02] Hypoxia [R09.02] COVID-19 virus infection [U07.1]   Discharge Diagnosis  Shortness of breath [R06.02] Hypoxia [R09.02] COVID-19 virus infection [U07.1]   Active Problems:   Acute respiratory disease due to COVID-19 virus   Essential hypertension   Hypokalemia      Past Medical History:  Diagnosis Date   Asthma    Diabetes mellitus without complication (HCC)    Hypertension     Past Surgical History:  Procedure Laterality Date   ABDOMINAL HYSTERECTOMY     arm surgery         History of present illness and  Hospital Course:     Kindly see H&P for history of present illness and admission details, please review complete Labs, Consult reports and Test reports for all details in brief  HPI  from the history and physical done on the day of admission 05/26/2019  HPI:  Tanya Boyd is a 63 y.o. female with medical history of asthma, hypertension, diabetes mellitus type 2 presenting with 1 day history of shortness of breath that began on 05/25/2019.  The patient had complained of approximately 4-day history of myalgias, arthralgias, nausea, vomiting, and cough prior to going to urgent care in [place] on 05/19/2019.  The patient was tested for COVID, and subsequently was contacted on 05/21/2019 to tell her that she was COVID positive.  The patient began having worse nausea, vomiting, and diarrhea.  She had abdominal cramping.  She came to the emergency department at any pain hospital on 05/23/2019.  She was treated symptomatically and  discharged home with appropriate instructions for isolation.  However, the patient stated that she continued to have some fevers up to 102.0 F, last time on 05/24/2019.  Because of shortness of breath that began on 05/25/2019 she presented for further evaluation.  She states that her vomiting and diarrhea and abdominal cramping have improved.  She denies any headache, neck pain, hemoptysis, abdominal pain, hematuria, hematochezia, melena.  She is not having any chest pain at this time. In the emergency department, the patient had a low-grade temperature of 99.1 F.  She was hemodynamically stable with oxygen saturation 96-97% on room air.  BMP showed a potassium 3.4, but was otherwise unremarkable.  Hospital Course   COVID19 pneumonia -Chest x-ray with worsening bilateral opacities, CRP are elevated at 5, ferritin elevated at 417, on admission, she is with no hypoxia, no indication for Remdesivir, plasma or Actemra, but she had significant wheezing due to asthma, she was started on steroids, her CRP significantly trended down to 1.2, she remains with no hypoxia, so she will be discharged on prednisone taper. -Patient was COVID-19 infection, with  elevated inflammatory markers, asthma exacerbation, she was high risk for decompensation, so she was kept overnight for close monitoring.    Recent Labs       Lab Results  Component Value Date   SARSCOV2NAA POSITIVE (A) 05/26/2019     Mild asthma exacerbation -Patient with known history of asthma, had significant wheezing yesterday, she was kept on IV Solu-Medrol 40 mg IV every 8 hours, her wheezing significantly subsided this morning, she appears to be more comfortable, she will be discharged today on prednisone taper, has Spiriva and Symbicort at home, as well has albuterol inhaler at home.  Diabetes mellitus type II -resume Metformin on discharge  Essential hypertension -Pressure remains soft, will hold  lisinopril and amlodipine on  discharge  Hypokalemia - Repleted    Discharge Condition:  stable  Follow UP  Follow-up Information    Melvyn Netholan, Frank, NP Follow up in 10 day(s).   Specialty: Family Medicine Why: Please inform PCP of your COVID-19 status when you call for appointment Contact information: [address] [place] [phone]             Discharge Instructions  and  Discharge Medications    Discharge Instructions    Discharge instructions   Complete by: As directed    Follow with Primary MD Melvyn Netholan, Frank, NP in 10 days   Get CBC, CMP,  checked  by Primary MD next visit.    Activity: As tolerated with Full fall precautions use walker/cane & assistance as needed   Disposition Home    Diet: Heart Healthy carb modified , with feeding assistance and aspiration precautions.  For Heart failure patients - Check your Weight same time everyday, if you gain over 2 pounds, or you develop in leg swelling, experience more shortness of breath or chest pain, call your Primary MD immediately. Follow Cardiac Low Salt Diet and 1.5 lit/day fluid restriction.   On your next visit with your primary care physician please Get Medicines reviewed and adjusted.   Please request your Prim.MD to go over all Hospital Tests and Procedure/Radiological results at the follow up, please get all Hospital records sent to your Prim MD by signing hospital release before you go home.   If you experience worsening of your admission symptoms, develop shortness of breath, life threatening emergency, suicidal or homicidal thoughts you must seek medical attention immediately by calling 911 or calling your MD immediately  if symptoms less severe.  You Must read complete instructions/literature along with all the possible adverse reactions/side effects for all the Medicines you take and that have been prescribed to you. Take any new Medicines after you have completely understood and accpet all the possible adverse  reactions/side effects.   Do not drive, operating heavy machinery, perform activities at heights, swimming or participation in water activities or provide baby sitting services if your were admitted for syncope or siezures until you have seen by Primary MD or a Neurologist and advised to do so again.  Do not drive when taking Pain medications.    Do not take more than prescribed Pain, Sleep and Anxiety Medications  Special Instructions: If you have smoked or chewed Tobacco  in the last 2 yrs please stop smoking, stop any regular Alcohol  and or any Recreational drug use.  Wear Seat belts while driving.   Please note  You were cared for by a hospitalist during your hospital stay. If you have any questions about your discharge medications or the care you received  while you were in the hospital after you are discharged, you can call the unit and asked to speak with the hospitalist on call if the hospitalist that took care of you is not available. Once you are discharged, your primary care physician will handle any further medical issues. Please note that NO REFILLS for any discharge medications will be authorized once you are discharged, as it is imperative that you return to your primary care physician (or establish a relationship with a primary care physician if you do not have one) for your aftercare needs so that they can reassess your need for medications and monitor your lab values.   Increase activity slowly   Complete by: As directed      Allergies as of 05/28/2019      Reactions   Lisinopril    Penicillins    Did it involve swelling of the face/tongue/throat, SOB, or low BP? Unknown Did it involve sudden or severe rash/hives, skin peeling, or any reaction on the inside of your mouth or nose? Unknown Did you need to seek medical attention at a hospital or doctor's office? Unknown When did it last happen? If all above answers are NO, may proceed with cephalosporin use.        Medication List    STOP taking these medications   amLODipine 5 MG tablet Commonly known as: NORVASC   losartan 25 MG tablet Commonly known as: COZAAR   ondansetron 4 MG tablet Commonly known as: ZOFRAN   potassium chloride SA 20 MEQ tablet Commonly known as: K-DUR     TAKE these medications   albuterol 108 (90 Base) MCG/ACT inhaler Commonly known as: VENTOLIN HFA Inhale 1-2 puffs into the lungs every 6 (six) hours as needed for wheezing or shortness of breath.   aspirin 81 MG EC tablet Take 81 mg by mouth daily.   dicyclomine 20 MG tablet Commonly known as: BENTYL Take 1 tablet (20 mg total) by mouth 2 (two) times daily.   loperamide 2 MG capsule Commonly known as: IMODIUM Take 1 capsule (2 mg total) by mouth 4 (four) times daily as needed for diarrhea or loose stools.   metFORMIN 500 MG tablet Commonly known as: GLUCOPHAGE Take 500 mg by mouth daily with breakfast.   omeprazole 20 MG capsule Commonly known as: PRILOSEC Take 20 mg by mouth daily.   predniSONE 5 MG (21) Tbpk tablet Commonly known as: STERAPRED UNI-PAK 21 TAB Use per package instruction   Symbicort 160-4.5 MCG/ACT inhaler Generic drug: budesonide-formoterol Inhale 2 puffs into the lungs 2 (two) times daily.   valACYclovir 500 MG tablet Commonly known as: VALTREX TAKE 1 CAPLET BY MOUTH ONCE DAILY         Diet and Activity recommendation: See Discharge Instructions above   Consults obtained -  None  Major procedures and Radiology Reports - PLEASE review detailed and final reports for all details, in brief -      Dg Chest Port 1 View  Result Date: 05/26/2019 CLINICAL DATA:  63 year old female with COVID-19, shortness of breath EXAM: PORTABLE CHEST 1 VIEW COMPARISON:  Prior chest x-ray 05/23/2019 FINDINGS: Stable cardiomegaly. The mediastinal contours remain within normal limits. Slightly increased patchy bilateral airspace opacities most notably in the right mid lung. No  pneumothorax or pleural effusion. No acute osseous abnormality. IMPRESSION: 1. Slight interval progression of bilateral patchy airspace opacities, particularly in the right mid lung. Findings concerning for progression of viral pneumonia. 2. Stable cardiomegaly. Electronically Signed   By: Myrle Sheng  Archer Asa M.D.   On: 05/26/2019 09:30   Dg Chest Portable 1 View  Result Date: 05/23/2019 CLINICAL DATA:  COVID-19, cough, abdominal pain, vomiting EXAM: PORTABLE CHEST 1 VIEW COMPARISON:  None. FINDINGS: Cardiomegaly. There is subtle bilateral heterogeneous opacity, most conspicuous in the peripheral mid lungs. Overlying soft tissue may exaggerate this appearance. The visualized skeletal structures are unremarkable. IMPRESSION: Cardiomegaly. There is subtle bilateral heterogeneous opacity, most conspicuous in the peripheral mid lungs, generally in keeping with reported COVID-19 diagnosis. Overlying soft tissue may exaggerate this appearance. Electronically Signed   By: Lauralyn Primes M.D.   On: 05/23/2019 10:56    Micro Results     Recent Results (from the past 240 hour(s))  Culture, blood (Routine X 2) w Reflex to ID Panel     Status: None (Preliminary result)   Collection Time: 05/26/19 11:54 AM   Specimen: BLOOD  Result Value Ref Range Status   Specimen Description BLOOD  Final   Special Requests NONE  Final   Culture   Final    NO GROWTH 2 DAYS Performed at [facility], [address] [place], [place] [address]    Report Status PENDING  Incomplete  SARS Coronavirus 2 [facility] order, Performed in [facility] hospital lab) Nasopharyngeal Nasopharyngeal Swab     Status: Abnormal   Collection Time: 05/26/19  1:00 PM   Specimen: Nasopharyngeal Swab  Result Value Ref Range Status   SARS Coronavirus 2 POSITIVE (A) NEGATIVE Final    Comment: RESULT CALLED TO, READ BACK BY AND VERIFIED WITH: TUTTLE. A. AT 1435 ON 05/26/2019 BY EVA (NOTE) If result is NEGATIVE SARS-CoV-2 target nucleic acids are  NOT DETECTED. The SARS-CoV-2 RNA is generally detectable in upper and lower  respiratory specimens during the acute phase of infection. The lowest  concentration of SARS-CoV-2 viral copies this assay can detect is 250  copies / mL. A negative result does not preclude SARS-CoV-2 infection  and should not be used as the sole basis for treatment or other  patient management decisions.  A negative result may occur with  improper specimen collection / handling, submission of specimen other  than nasopharyngeal swab, presence of viral mutation(s) within the  areas targeted by this assay, and inadequate number of viral copies  (<250 copies / mL). A negative result must be combined with clinical  observations, patient history, and epidemiological information. If result is POSITIVE SARS-CoV-2 target nucleic acids are DETECTED . The SARS-CoV-2 RNA is generally detectable in upper and lower  respiratory specimens during the acute phase of infection.  Positive  results are indicative of active infection with SARS-CoV-2.  Clinical  correlation with patient history and other diagnostic information is  necessary to determine patient infection status.  Positive results do  not rule out bacterial infection or co-infection with other viruses. If result is PRESUMPTIVE POSTIVE SARS-CoV-2 nucleic acids MAY BE PRESENT.   A presumptive positive result was obtained on the submitted specimen  and confirmed on repeat testing.  While 2019 novel coronavirus  (SARS-CoV-2) nucleic acids may be present in the submitted sample  additional confirmatory testing may be necessary for epidemiological  and / or clinical management purposes  to differentiate between  SARS-CoV-2 and other Sarbecovirus currently known to infect humans.  If clinically indicated additional testing with an alternate test  methodology [phone] ) is advised. The SARS-CoV-2 RNA is generally  detectable in upper and lower respiratory specimens  during the acute  phase of infection. The expected result is Negative.  Fact Sheet for Patients:  BoilerBrush.com.cyhttps://www.fda.gov/media/136312/download Fact Sheet for Healthcare Providers: https://pope.com/https://www.fda.gov/media/136313/download This test is not yet approved or cleared by the [place] FDA and has been authorized for detection and/or diagnosis of SARS-CoV-2 by FDA under an Emergency Use Authorization (EUA).  This EUA will remain in effect (meaning this test can be used) for the duration of the COVID-19 declaration under Section 564(b)(1) of the Act, 21 U.S.C. section 360bbb-3(b)(1), unless the authorization is terminated or revoked sooner. Performed at [facility], [address] [place], [place] [address]   Culture, blood (Routine X 2) w Reflex to ID Panel     Status: None (Preliminary result)   Collection Time: 05/26/19  3:21 PM   Specimen: BLOOD RIGHT FOREARM  Result Value Ref Range Status   Specimen Description BLOOD RIGHT FOREARM BOTTLES DRAWN AEROBIC ONLY  Final   Special Requests Blood Culture adequate volume  Final   Culture   Final    NO GROWTH 2 DAYS Performed at [facility], [address] [place], [place] [address]    Report Status PENDING  Incomplete       Today   Subjective:   Tanya Boyd today has no headache,no chest or abdominal pain,no new weakness tingling or numbness, feels much better wants to go home today.   Objective:   Blood pressure 112/65, pulse (!) 56, temperature 97.6 F (36.4 C), temperature source Oral, resp. rate 18, height 5\' 7"  (1.702 m), weight 102 kg, SpO2 97 %.   Intake/Output Summary (Last 24 hours) at 05/28/2019 1126 Last data filed at 05/27/2019 2200 Gross per 24 hour  Intake 440 ml  Output --  Net 440 ml    Exam Awake Alert, Oriented x 3, No new F.N deficits, Normal affect Symmetrical Chest wall movement, Good air movement bilaterally, there was minimal scattered wheezing today, but she appears comfortable, with no increased  work of breathing RRR,No Gallops,Rubs or new Murmurs, No Parasternal Heave +ve B.Sounds, Abd Soft, Non tender, No organomegaly appriciated, No rebound -guarding or rigidity. No Cyanosis, Clubbing or edema, No new Rash or bruise  Data Review   CBC w Diff:  Lab Results  Component Value Date   WBC 13.4 (H) 05/28/2019   HGB 12.6 05/28/2019   HCT 38.7 05/28/2019   PLT 244 05/28/2019   LYMPHOPCT 8 05/28/2019   MONOPCT 3 05/28/2019   EOSPCT 0 05/28/2019   BASOPCT 0 05/28/2019    CMP:  Lab Results  Component Value Date   NA 141 05/28/2019   K 4.4 05/28/2019   CL 106 05/28/2019   CO2 22 05/28/2019   BUN 15 05/28/2019   CREATININE 1.04 (H) 05/28/2019   PROT 7.2 05/28/2019   ALBUMIN 3.4 (L) 05/28/2019   BILITOT 0.5 05/28/2019   ALKPHOS 62 05/28/2019   AST 32 05/28/2019   ALT 31 05/28/2019  .   Total Time in preparing paper work, data evaluation and todays exam - 35 minutes  Tanya Boyd M.D on 05/28/2019 at 11:26 AM  [facility]   Office  [phone]

## 2019-05-28 NOTE — Progress Notes (Signed)
Reviewed all d/c instructions with patient and she verbalized understanding.  Copy given.  Patient verbalized understanding of [place] form and signed.  IV d/ced.  Son called and will be here to pick up patient at 1400hrs.

## 2019-05-28 NOTE — Discharge Instructions (Signed)
° ° °Person Under Monitoring Name: Tanya Boyd ° °Location: [address] °[place] [address] ° ° °Infection Prevention Recommendations for Individuals Confirmed to have, °or Being Evaluated for, 2019 Novel Coronavirus (COVID-19) Infection Who °Receive Care at Home ° °Individuals who are confirmed to have, or are being evaluated for, COVID-19 should follow the prevention steps below °until a healthcare provider or local or state health department says they can return to normal activities. ° °Stay home except to get medical care °You should restrict activities outside your home, except for getting medical care. Do not go to work, school, or public °areas, and do not use public transportation or taxis. ° °Call ahead before visiting your doctor °Before your medical appointment, call the healthcare provider and tell them that you have, or are being evaluated for, °COVID-19 infection. This will help the healthcare provider’s office take steps to keep other people from getting infected. °Ask your healthcare provider to call the local or state health department. ° °Monitor your symptoms °Seek prompt medical attention if your illness is worsening (e.g., difficulty breathing). Before going to your medical °appointment, call the healthcare provider and tell them that you have, or are being evaluated for, COVID-19 infection. Ask °your healthcare provider to call the local or state health department. ° °Wear a facemask °You should wear a facemask that covers your nose and mouth when you are in the same room with other people and °when you visit a healthcare provider. People who live with or visit you should also wear a facemask while they are in the °same room with you. ° °Separate yourself from other people in your home °As much as possible, you should stay in a different room from other people in your home. Also, you should use a separate °bathroom, if available. ° °Avoid sharing household items °You should not  share dishes, drinking glasses, cups, eating utensils, towels, bedding, or other items with other people in °your home. After using these items, you should wash them thoroughly with soap and water. ° °Cover your coughs and sneezes °Cover your mouth and nose with a tissue when you cough or sneeze, or you can cough or sneeze into your sleeve. Throw °used tissues in a lined trash can, and immediately wash your hands with soap and water for at least 20 seconds or use an °alcohol-based hand rub. ° °Wash your hands °Wash your hands often and thoroughly with soap and water for at least 20 seconds. You can use an alcohol-based hand °sanitizer if soap and water are not available and if your hands are not visibly dirty. Avoid touching your eyes, nose, and °mouth with unwashed hands. ° ° °Prevention Steps for Caregivers and Household Members of °Individuals Confirmed to have, or Being Evaluated for, COVID-19 Infection Being Cared for in the Home ° °If you live with, or provide care at home for, a person confirmed to have, or being evaluated for, COVID-19 infection °please follow these guidelines to prevent infection: ° °Follow healthcare provider’s instructions °Make sure that you understand and can help the patient follow any healthcare provider instructions for all care. ° °Provide for the patient’s basic needs °You should help the patient with basic needs in the home and provide support for getting groceries, prescriptions, and °other personal needs. ° °Monitor the patient’s symptoms °If they are getting sicker, call his or her medical provider and tell them that the patient has, or is being evaluated for, °COVID-19 infection. This will help the healthcare provider’s office   take steps to keep other people from getting infected. Ask the healthcare provider to call the local or state health department.  Limit the number of people who have contact with the patient  If possible, have only one caregiver for the  patient.  Other household members should stay in another home or place of residence. If this is not possible, they should stay  in another room, or be separated from the patient as much as possible. Use a separate bathroom, if available.  Restrict visitors who do not have an essential need to be in the home.  Keep older adults, very young children, and other sick people away from the patient Keep older adults, very young children, and those who have compromised immune systems or chronic health conditions away from the patient. This includes people with chronic heart, lung, or kidney conditions, diabetes, and cancer.  Ensure good ventilation Make sure that shared spaces in the home have good air flow, such as from an air conditioner or an opened window, weather permitting.  Wash your hands often  Wash your hands often and thoroughly with soap and water for at least 20 seconds. You can use an alcohol based hand sanitizer if soap and water are not available and if your hands are not visibly dirty.  Avoid touching your eyes, nose, and mouth with unwashed hands.  Use disposable paper towels to dry your hands. If not available, use dedicated cloth towels and replace them when they become wet.  Wear a facemask and gloves  Wear a disposable facemask at all times in the room and gloves when you touch or have contact with the patients blood, body fluids, and/or secretions or excretions, such as sweat, saliva, sputum, nasal mucus, vomit, urine, or feces.  Ensure the mask fits over your nose and mouth tightly, and do not touch it during use.  Throw out disposable facemasks and gloves after using them. Do not reuse.  Wash your hands immediately after removing your facemask and gloves.  If your personal clothing becomes contaminated, carefully remove clothing and launder. Wash your hands after handling contaminated clothing.  Place all used disposable facemasks, gloves, and other waste in a lined  container before disposing them with other household waste.  Remove gloves and wash your hands immediately after handling these items.  Do not share dishes, glasses, or other household items with the patient  Avoid sharing household items. You should not share dishes, drinking glasses, cups, eating utensils, towels, bedding, or other items with a patient who is confirmed to have, or being evaluated for, COVID-19 infection.  After the person uses these items, you should wash them thoroughly with soap and water.  Wash laundry thoroughly  Immediately remove and wash clothes or bedding that have blood, body fluids, and/or secretions or excretions, such as sweat, saliva, sputum, nasal mucus, vomit, urine, or feces, on them.  Wear gloves when handling laundry from the patient.  Read and follow directions on labels of laundry or clothing items and detergent. In general, wash and dry with the warmest temperatures recommended on the label.  Clean all areas the individual has used often  Clean all touchable surfaces, such as counters, tabletops, doorknobs, bathroom fixtures, toilets, phones, keyboards, tablets, and bedside tables, every day. Also, clean any surfaces that may have blood, body fluids, and/or secretions or excretions on them.  Wear gloves when cleaning surfaces the patient has come in contact with.  Use a diluted bleach solution (e.g., dilute bleach with 1 part  bleach and 10 parts water) or a household disinfectant with a label that says EPA-registered for coronaviruses. To make a bleach solution at home, add 1 tablespoon of bleach to 1 quart (4 cups) of water. For a larger supply, add  cup of bleach to 1 gallon (16 cups) of water.  Read labels of cleaning products and follow recommendations provided on product labels. Labels contain instructions for safe and effective use of the cleaning product including precautions you should take when applying the product, such as wearing gloves or  eye protection and making sure you have good ventilation during use of the product.  Remove gloves and wash hands immediately after cleaning.  Monitor yourself for signs and symptoms of illness Caregivers and household members are considered close contacts, should monitor their health, and will be asked to limit movement outside of the home to the extent possible. Follow the monitoring steps for close contacts listed on the symptom monitoring form.   ? If you have additional questions, contact your local health department or call the epidemiologist on call at [phone] (available 24/7). ? This guidance is subject to change. For the most up-to-date guidance from [facility], please refer to their website: [email or web address]

## 2019-05-31 LAB — CULTURE, BLOOD (ROUTINE X 2)
Culture: NO GROWTH
Culture: NO GROWTH
Special Requests: ADEQUATE

## 2019-09-01 ENCOUNTER — Encounter (HOSPITAL_COMMUNITY): Payer: Self-pay | Admitting: Emergency Medicine

## 2019-09-01 ENCOUNTER — Other Ambulatory Visit: Payer: Self-pay

## 2019-09-01 ENCOUNTER — Emergency Department (HOSPITAL_COMMUNITY)
Admission: EM | Admit: 2019-09-01 | Discharge: 2019-09-01 | Disposition: A | Discharge status: Home / Self Care | Payer: Medicare Other | Attending: Emergency Medicine | Admitting: Emergency Medicine

## 2019-09-01 DIAGNOSIS — I10 Essential (primary) hypertension: Secondary | ICD-10-CM | POA: Diagnosis not present

## 2019-09-01 DIAGNOSIS — Z7984 Long term (current) use of oral hypoglycemic drugs: Secondary | ICD-10-CM | POA: Insufficient documentation

## 2019-09-01 DIAGNOSIS — R112 Nausea with vomiting, unspecified: Secondary | ICD-10-CM

## 2019-09-01 DIAGNOSIS — E119 Type 2 diabetes mellitus without complications: Secondary | ICD-10-CM | POA: Diagnosis not present

## 2019-09-01 DIAGNOSIS — Z79899 Other long term (current) drug therapy: Secondary | ICD-10-CM | POA: Diagnosis not present

## 2019-09-01 DIAGNOSIS — R1013 Epigastric pain: Secondary | ICD-10-CM | POA: Insufficient documentation

## 2019-09-01 DIAGNOSIS — Z7982 Long term (current) use of aspirin: Secondary | ICD-10-CM | POA: Insufficient documentation

## 2019-09-01 DIAGNOSIS — J45909 Unspecified asthma, uncomplicated: Secondary | ICD-10-CM | POA: Diagnosis not present

## 2019-09-01 HISTORY — DX: COVID-19: U07.1

## 2019-09-01 LAB — CBC
HCT: 40.5 % (ref 36.0–46.0)
Hemoglobin: 13.2 g/dL (ref 12.0–15.0)
MCH: 28 pg (ref 26.0–34.0)
MCHC: 32.6 g/dL (ref 30.0–36.0)
MCV: 85.8 fL (ref 80.0–100.0)
Platelets: 263 10*3/uL (ref 150–400)
RBC: 4.72 MIL/uL (ref 3.87–5.11)
RDW: 14.2 % (ref 11.5–15.5)
WBC: 9.2 10*3/uL (ref 4.0–10.5)
nRBC: 0 % (ref 0.0–0.2)

## 2019-09-01 LAB — COMPREHENSIVE METABOLIC PANEL
ALT: 25 U/L (ref 0–44)
AST: 24 U/L (ref 15–41)
Albumin: 4.4 g/dL (ref 3.5–5.0)
Alkaline Phosphatase: 70 U/L (ref 38–126)
Anion gap: 13 (ref 5–15)
BUN: 11 mg/dL (ref 8–23)
CO2: 25 mmol/L (ref 22–32)
Calcium: 9.7 mg/dL (ref 8.9–10.3)
Chloride: 104 mmol/L (ref 98–111)
Creatinine, Ser: 0.74 mg/dL (ref 0.44–1.00)
GFR calc Af Amer: >60 mL/min (ref >60)
GFR calc non Af Amer: >60 mL/min (ref >60)
Glucose, Bld: 133 mg/dL — ABNORMAL HIGH (ref 70–99)
Potassium: 3.8 mmol/L (ref 3.5–5.1)
Sodium: 142 mmol/L (ref 135–145)
Total Bilirubin: 2 mg/dL — ABNORMAL HIGH (ref 0.3–1.2)
Total Protein: 7.6 g/dL (ref 6.5–8.1)

## 2019-09-01 LAB — URINALYSIS, ROUTINE W REFLEX MICROSCOPIC
Bilirubin Urine: NEGATIVE
Glucose, UA: NEGATIVE mg/dL
Hgb urine dipstick: NEGATIVE
Ketones, ur: 5 mg/dL — AB
Leukocytes,Ua: NEGATIVE
Nitrite: NEGATIVE
Protein, ur: NEGATIVE mg/dL
Specific Gravity, Urine: 1.015 (ref 1.005–1.030)
pH: 7 (ref 5.0–8.0)

## 2019-09-01 LAB — LIPASE, BLOOD: Lipase: 33 U/L (ref 11–51)

## 2019-09-01 MED ORDER — LIDOCAINE VISCOUS HCL 2 % MT SOLN
15.0000 mL | Freq: Once | OROMUCOSAL | Status: AC
  Administered 2019-09-01: 15 mL via ORAL
  Filled 2019-09-01: qty 15

## 2019-09-01 MED ORDER — PANTOPRAZOLE SODIUM 20 MG PO TBEC: 20.0000 mg | DELAYED_RELEASE_TABLET | Freq: Every day | ORAL | 0 refills | Status: AC

## 2019-09-01 MED ORDER — ONDANSETRON HCL 4 MG PO TABS: 4.0000 mg | ORAL_TABLET | Freq: Four times a day (QID) | ORAL | 0 refills | Status: AC

## 2019-09-01 MED ORDER — SUCRALFATE 1 G PO TABS: 1.0000 g | ORAL_TABLET | Freq: Three times a day (TID) | ORAL | 0 refills | Status: AC

## 2019-09-01 MED ORDER — ALUM & MAG HYDROXIDE-SIMETH 200-200-20 MG/5ML PO SUSP
30.0000 mL | Freq: Once | ORAL | Status: AC
  Administered 2019-09-01: 30 mL via ORAL
  Filled 2019-09-01: qty 30

## 2019-09-01 MED ORDER — SODIUM CHLORIDE 0.9% FLUSH: 3.0000 mL | Freq: Once | INTRAVENOUS | Status: DC

## 2019-09-01 MED ORDER — ONDANSETRON HCL 4 MG/2ML IJ SOLN
4.0000 mg | Freq: Once | INTRAMUSCULAR | Status: AC
  Administered 2019-09-01: 10:00:00 4 mg via INTRAVENOUS
  Filled 2019-09-01: qty 2

## 2019-09-01 MED ORDER — SODIUM CHLORIDE 0.9 % IV BOLUS
500.0000 mL | Freq: Once | INTRAVENOUS | Status: AC
  Administered 2019-09-01: 500 mL via INTRAVENOUS

## 2019-09-01 MED ORDER — FAMOTIDINE IN NACL 20-0.9 MG/50ML-% IV SOLN
20.0000 mg | Freq: Once | INTRAVENOUS | Status: AC
  Administered 2019-09-01: 20 mg via INTRAVENOUS
  Filled 2019-09-01: qty 50

## 2019-09-01 MED ORDER — SUCRALFATE 1 GM/10ML PO SUSP: 1.0000 g | Freq: Three times a day (TID) | ORAL | 0 refills | Status: DC

## 2019-09-01 NOTE — Discharge Instructions (Signed)
Take Zofran every 6 hours as needed for nausea or vomiting.  Take Protonix daily to help with possible ulcer or gastritis symptoms.  Take Carafate before eating and at bedtime.  Begin with bland foods such as bananas, rice, applesauce, toast.  Avoid spicy, greasy, acidic foods for right now.  Also avoid alcohol or NSAID medication.  Please return the emergency department develop any new or worsening symptoms including severe, localized abdominal pain, intractable vomiting, or any other concerning symptom.  Please follow-up with your doctor if your symptoms are not improving.

## 2019-09-01 NOTE — ED Triage Notes (Signed)
Patient c/o generalized abd pain with nausea and vomiting that started yesterday after eating a tuna fish salad from [employer]. Denies any fevers, diarrhea, or urinary symptoms. Per patient last BM yesterday-normal. Patient has hx of gastritis.

## 2019-09-01 NOTE — ED Provider Notes (Signed)
[facility] EMERGENCY DEPARTMENT Provider Note   CSN: [id number] Arrival date & time: 09/01/19  0093     History   Chief Complaint Chief Complaint  Patient presents with  . Abdominal Pain    HPI Tanya Boyd is a 63 y.o. female with history of hypertension, diabetes, asthma who presents with a 2-day history of epigastric pain.  Patient started with nausea and vomiting yesterday after eating a [employer] sandwich.  She denies any fever, chest pain, shortness of breath, cough, urinary symptoms.  Patient ports some radiation of her pain to her back across her shoulder blades at times.  Patient has history of gastritis several years ago.  She does not take NSAID medication or drink alcohol.  Patient was seen yesterday in [place] for work-up of abnormal EKG after going to urgent care, but she did not have a work-up regarding her abdominal pain and vomiting.  Patient reports she has always had an abnormal EKG.     HPI  Past Medical History:  Diagnosis Date  . Asthma   . COVID-19   . Diabetes mellitus without complication ([place])   . Hypertension     Patient Active Problem List   Diagnosis Date Noted  . Acute respiratory disease due to COVID-19 virus 05/26/2019  . Essential hypertension 05/26/2019  . Hypokalemia 05/26/2019    Past Surgical History:  Procedure Laterality Date  . ABDOMINAL HYSTERECTOMY    . arm surgery    . CESAREAN SECTION    . CESAREAN SECTION       OB History    Gravida  2   Para  2   Term  2   Preterm      AB      Living  2     SAB      TAB      Ectopic      Multiple      Live Births               Home Medications    Prior to Admission medications   Medication Sig Start Date End Date Taking? Authorizing Provider  amLODipine (NORVASC) 5 MG tablet Take 5 mg by mouth daily. 08/15/19  Yes [provider]  aspirin 81 MG EC tablet Take 81 mg by mouth daily.    Yes [provider]  losartan (COZAAR) 25 MG tablet Take  25 mg by mouth daily. 06/27/19  Yes [provider]  metFORMIN (GLUCOPHAGE) 500 MG tablet Take 500 mg by mouth daily with breakfast.    Yes [provider]  SYMBICORT 160-4.5 MCG/ACT inhaler Inhale 2 puffs into the lungs 2 (two) times daily. 01/19/19  Yes [provider]  valACYclovir (VALTREX) 500 MG tablet TAKE 1 CAPLET BY MOUTH ONCE DAILY 03/30/19  Yes [provider]  albuterol (VENTOLIN HFA) 108 (90 Base) MCG/ACT inhaler Inhale 1-2 puffs into the lungs every 6 (six) hours as needed for wheezing or shortness of breath.    [provider]  dicyclomine (BENTYL) 20 MG tablet Take 1 tablet (20 mg total) by mouth 2 (two) times daily. Patient not taking: Reported on 09/01/2019 05/23/19   Long, Wonda Olds, MD  loperamide (IMODIUM) 2 MG capsule Take 1 capsule (2 mg total) by mouth 4 (four) times daily as needed for diarrhea or loose stools. Patient not taking: Reported on 09/01/2019 05/23/19   Margette Fast, MD  omeprazole (PRILOSEC) 20 MG capsule Take 20 mg by mouth daily. 05/21/19   [provider]  ondansetron (ZOFRAN) 4 MG tablet Take 1 tablet (4 mg total) by mouth every 6 (six) hours. 09/01/19   Bryndon Cumbie, Waylan BogaAlexandra M, PA-C  pantoprazole (PROTONIX) 20 MG tablet Take 1 tablet (20 mg total) by mouth daily. 09/01/19   Elita Dame, Waylan BogaAlexandra M, PA-C  predniSONE (STERAPRED UNI-PAK 21 TAB) 5 MG (21) TBPK tablet Use per package instruction Patient not taking: Reported on 09/01/2019 05/28/19   Elgergawy, Leana Roeawood S, MD  sucralfate (CARAFATE) 1 g tablet Take 1 tablet (1 g total) by mouth 4 (four) times daily -  with meals and at bedtime. 09/01/19   Emi HolesLaw, Tameron Lama M, PA-C    Family History History reviewed. No pertinent family history.  Social History Social History   Tobacco Use  . Smoking status: Never Smoker  . Smokeless tobacco: Never Used  Substance Use Topics  . Alcohol use: Never    Frequency: Never  . Drug use: Never     Allergies   Lisinopril and Penicillins    Review of Systems Review of Systems  Constitutional: Negative for chills and fever.  HENT: Negative for facial swelling and sore throat.   Respiratory: Negative for shortness of breath.   Cardiovascular: Negative for chest pain.  Gastrointestinal: Positive for abdominal pain, nausea and vomiting. Negative for blood in stool and diarrhea.  Genitourinary: Negative for dysuria.  Musculoskeletal: Negative for back pain.  Skin: Negative for rash and wound.  Neurological: Negative for headaches.  Psychiatric/Behavioral: The patient is not nervous/anxious.      Physical Exam Updated Vital Signs BP 138/86   Pulse 60   Temp 98.5 F (36.9 C) (Oral)   Resp 16   Ht 5\' 8"  (1.727 m)   Wt 102.1 kg   SpO2 100%   BMI 34.21 kg/m   Physical Exam Vitals signs and nursing note reviewed.  Constitutional:      General: She is not in acute distress.    Appearance: She is well-developed. She is obese. She is not diaphoretic.  HENT:     Head: Normocephalic and atraumatic.     Mouth/Throat:     Pharynx: No oropharyngeal exudate.  Eyes:     General: No scleral icterus.       Right eye: No discharge.        Left eye: No discharge.     Conjunctiva/sclera: Conjunctivae normal.     Pupils: Pupils are equal, round, and reactive to light.  Neck:     Musculoskeletal: Normal range of motion and neck supple.     Thyroid: No thyromegaly.  Cardiovascular:     Rate and Rhythm: Normal rate and regular rhythm.     Heart sounds: Normal heart sounds. No murmur. No friction rub. No gallop.   Pulmonary:     Effort: Pulmonary effort is normal. No respiratory distress.     Breath sounds: Normal breath sounds. No stridor. No wheezing or rales.  Abdominal:     General: Bowel sounds are normal. There is no distension.     Palpations: Abdomen is soft.     Tenderness: There is abdominal tenderness in the epigastric area. There is no right CVA tenderness, left CVA tenderness, guarding or rebound. Negative signs  include Murphy's sign and McBurney's sign.  Lymphadenopathy:     Cervical: No cervical adenopathy.  Skin:    General: Skin is warm and dry.     Coloration: Skin is not pale.     Findings: No rash.  Neurological:     Mental Status: She  is alert.     Coordination: Coordination normal.      ED Treatments / Results  Labs (all labs ordered are listed, but only abnormal results are displayed) Labs Reviewed  COMPREHENSIVE METABOLIC PANEL - Abnormal; Notable for the following components:      Result Value   Glucose, Bld 133 (*)    Total Bilirubin 2.0 (*)    All other components within normal limits  URINALYSIS, ROUTINE W REFLEX MICROSCOPIC - Abnormal; Notable for the following components:   APPearance HAZY (*)    Ketones, ur 5 (*)    All other components within normal limits  LIPASE, BLOOD  CBC    EKG None  Radiology No results found.  Procedures Procedures (including critical care time)  Medications Ordered in ED Medications  sodium chloride flush (NS) 0.9 % injection 3 mL (3 mLs Intravenous Not Given 09/01/19 0946)  sodium chloride 0.9 % bolus 500 mL (0 mLs Intravenous Stopped 09/01/19 1120)  ondansetron (ZOFRAN) injection 4 mg (4 mg Intravenous Given 09/01/19 1019)  famotidine (PEPCID) IVPB 20 mg premix (0 mg Intravenous Stopped 09/01/19 1049)  alum & mag hydroxide-simeth (MAALOX/MYLANTA) 200-200-20 MG/5ML suspension 30 mL (30 mLs Oral Given 09/01/19 1019)    And  lidocaine (XYLOCAINE) 2 % viscous mouth solution 15 mL (15 mLs Oral Given 09/01/19 1019)     Initial Impression / Assessment and Plan / ED Course  I have reviewed the triage vital signs and the nursing notes.  Pertinent labs & imaging results that were available during my care of the patient were reviewed by me and considered in my medical decision making (see chart for details).        Patient presenting with epigastric pain, nausea, vomiting.  Symptoms improved in the ED with GI cocktail, Pepcid, Zofran,  fluids.  She is tolerating oral fluids.  Mild tenderness at epigastrium.  No right upper quadrant tenderness.  No dictations for imaging at this time.  Labs are unremarkable except for mild elevation in bilirubin.  Low suspicion of gallbladder etiology without tenderness.  Suspect gastritis or gastroenteritis.  Discharge home with Zofran, Protonix, Carafate.  Follow-up to PCP symptoms not improving.  Return precautions discussed.  Patient understands and agrees with plan.  Patient vitals stable throughout ED course and discharged in satisfactory condition.  Final Clinical Impressions(s) / ED Diagnoses   Final diagnoses:  Epigastric pain  Non-intractable vomiting with nausea, unspecified vomiting type    ED Discharge Orders         Ordered    ondansetron (ZOFRAN) 4 MG tablet  Every 6 hours     09/01/19 1151    pantoprazole (PROTONIX) 20 MG tablet  Daily     09/01/19 1151    sucralfate (CARAFATE) 1 GM/10ML suspension  3 times daily with meals & bedtime,   Status:  Discontinued     09/01/19 1151    sucralfate (CARAFATE) 1 g tablet  3 times daily with meals & bedtime     09/01/19 1223           Emi Holes, PA-C 09/01/19 1246    Bethann Berkshire, MD 09/06/19 [phone]

## 2020-12-26 IMAGING — CR PORTABLE CHEST - 1 VIEW
1 series · 1 of 1 positions shown · non-contrast
Comparison: None.

CLINICAL DATA: [id number], cough, abdominal pain, vomiting

EXAM:
PORTABLE CHEST 1 VIEW

[portable]
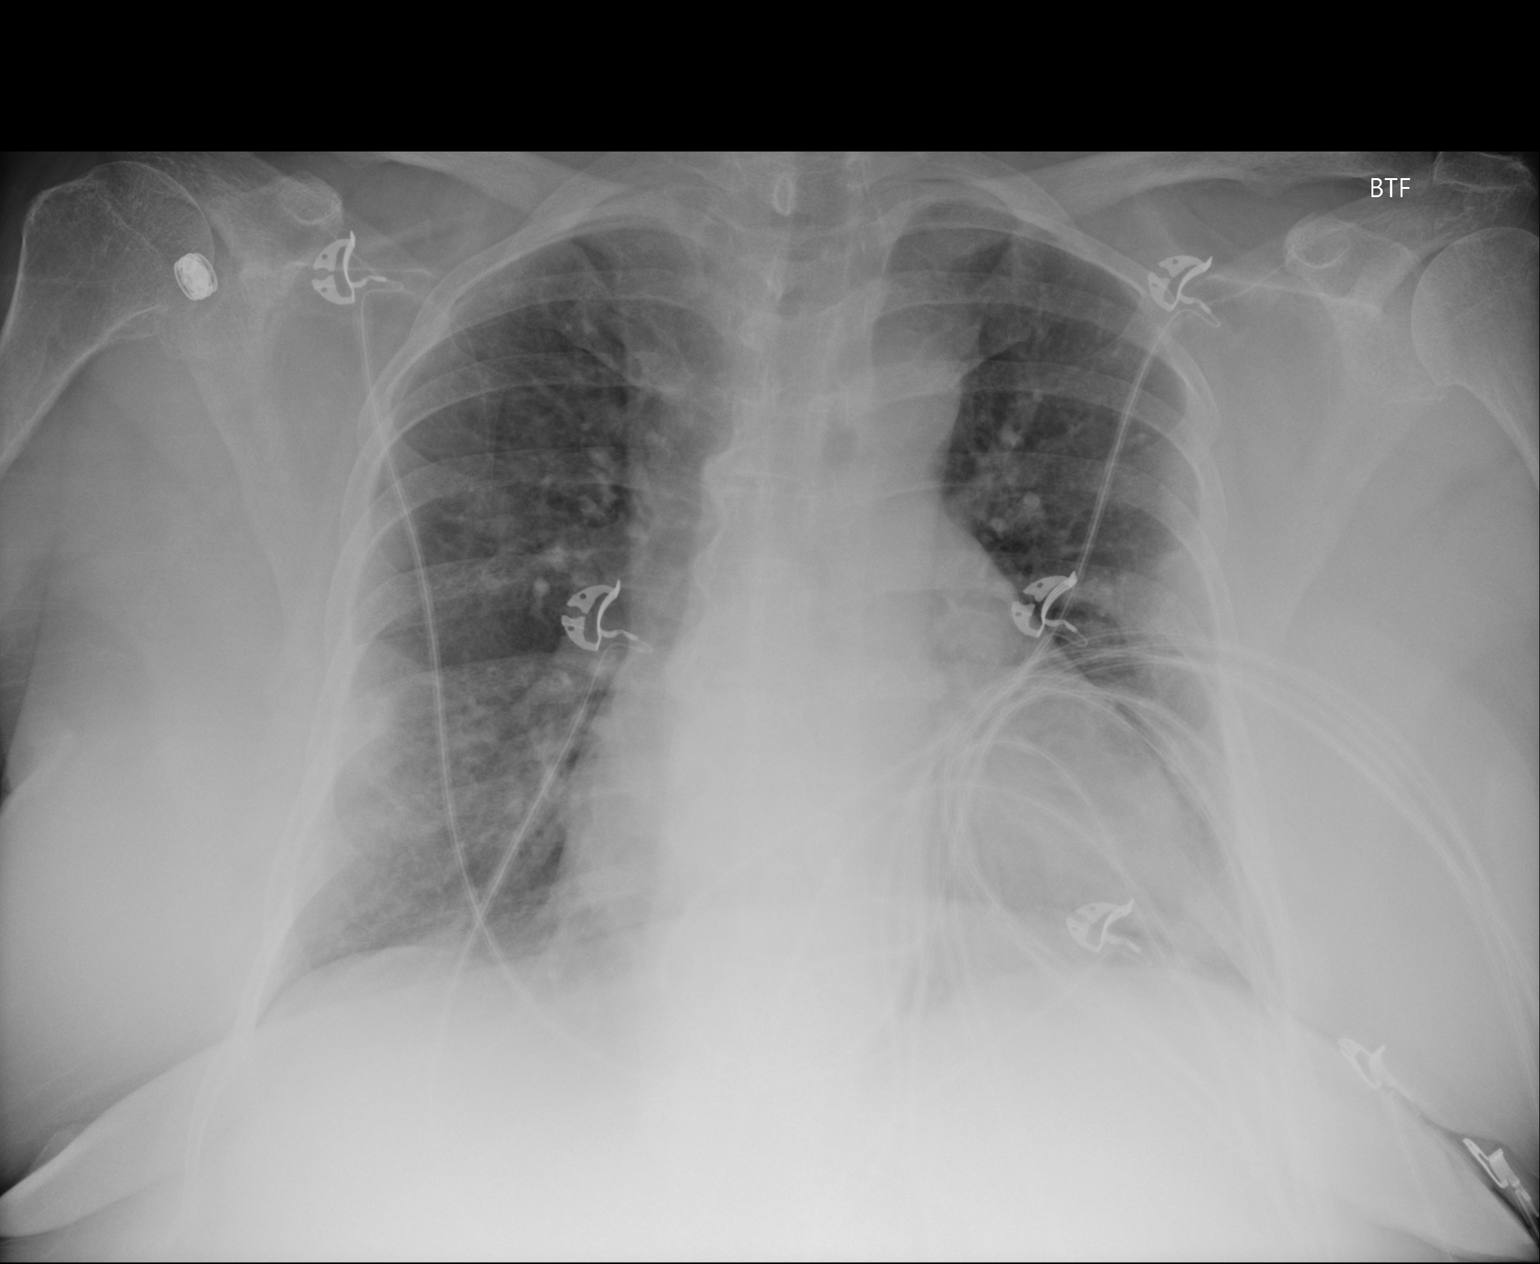

[1 of 1 positions shown; findings below may reference images not displayed]

FINDINGS: Cardiomegaly. There is subtle bilateral heterogeneous opacity, most
conspicuous in the peripheral mid lungs. Overlying soft tissue may
exaggerate this appearance. The visualized skeletal structures are
unremarkable.
IMPRESSION: Cardiomegaly. There is subtle bilateral heterogeneous opacity, most
conspicuous in the peripheral mid lungs, generally in keeping with
reported [id number] diagnosis. Overlying soft tissue may exaggerate
this appearance.

## 2020-12-29 IMAGING — CR PORTABLE CHEST - 1 VIEW
2 series · 2 of 2 positions shown · non-contrast
Comparison: Prior chest x-ray 05/23/2019

CLINICAL DATA: 62-year-old female with [id number], shortness of
breath

EXAM:
PORTABLE CHEST 1 VIEW

[portable (1 of 2)]
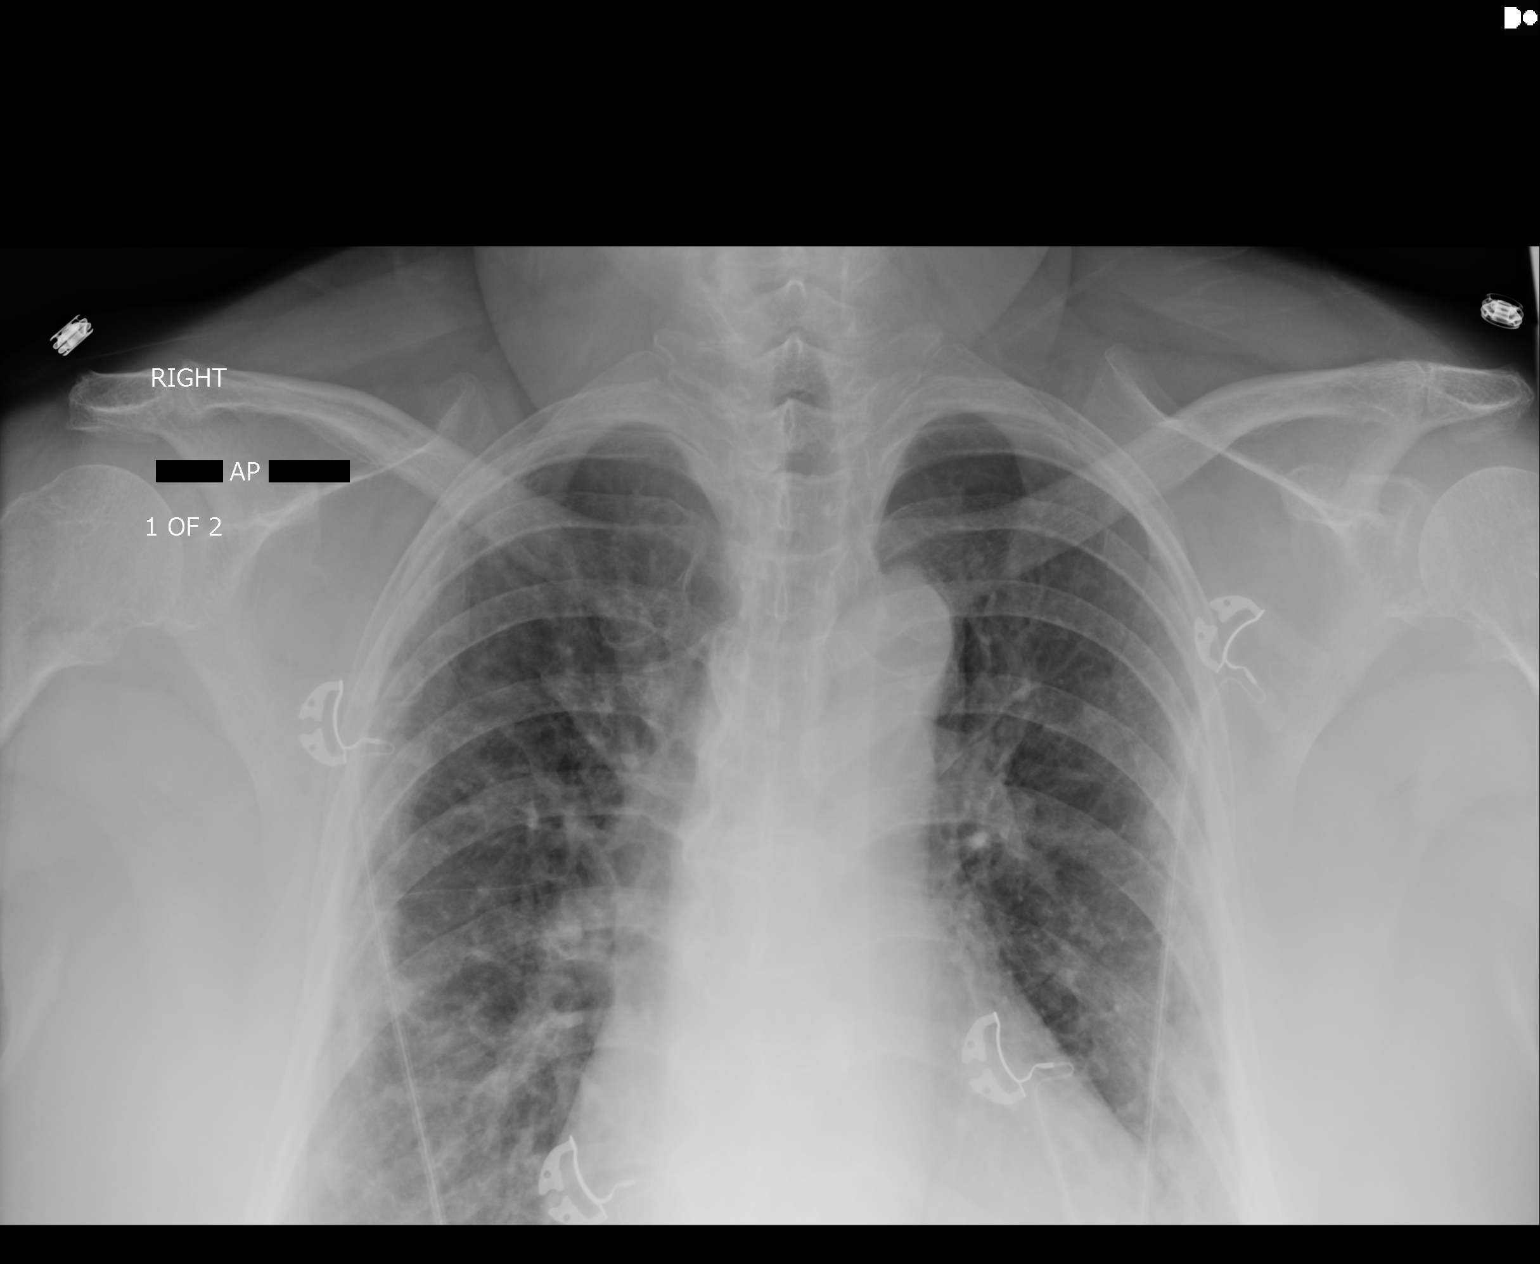

[portable (2 of 2)]
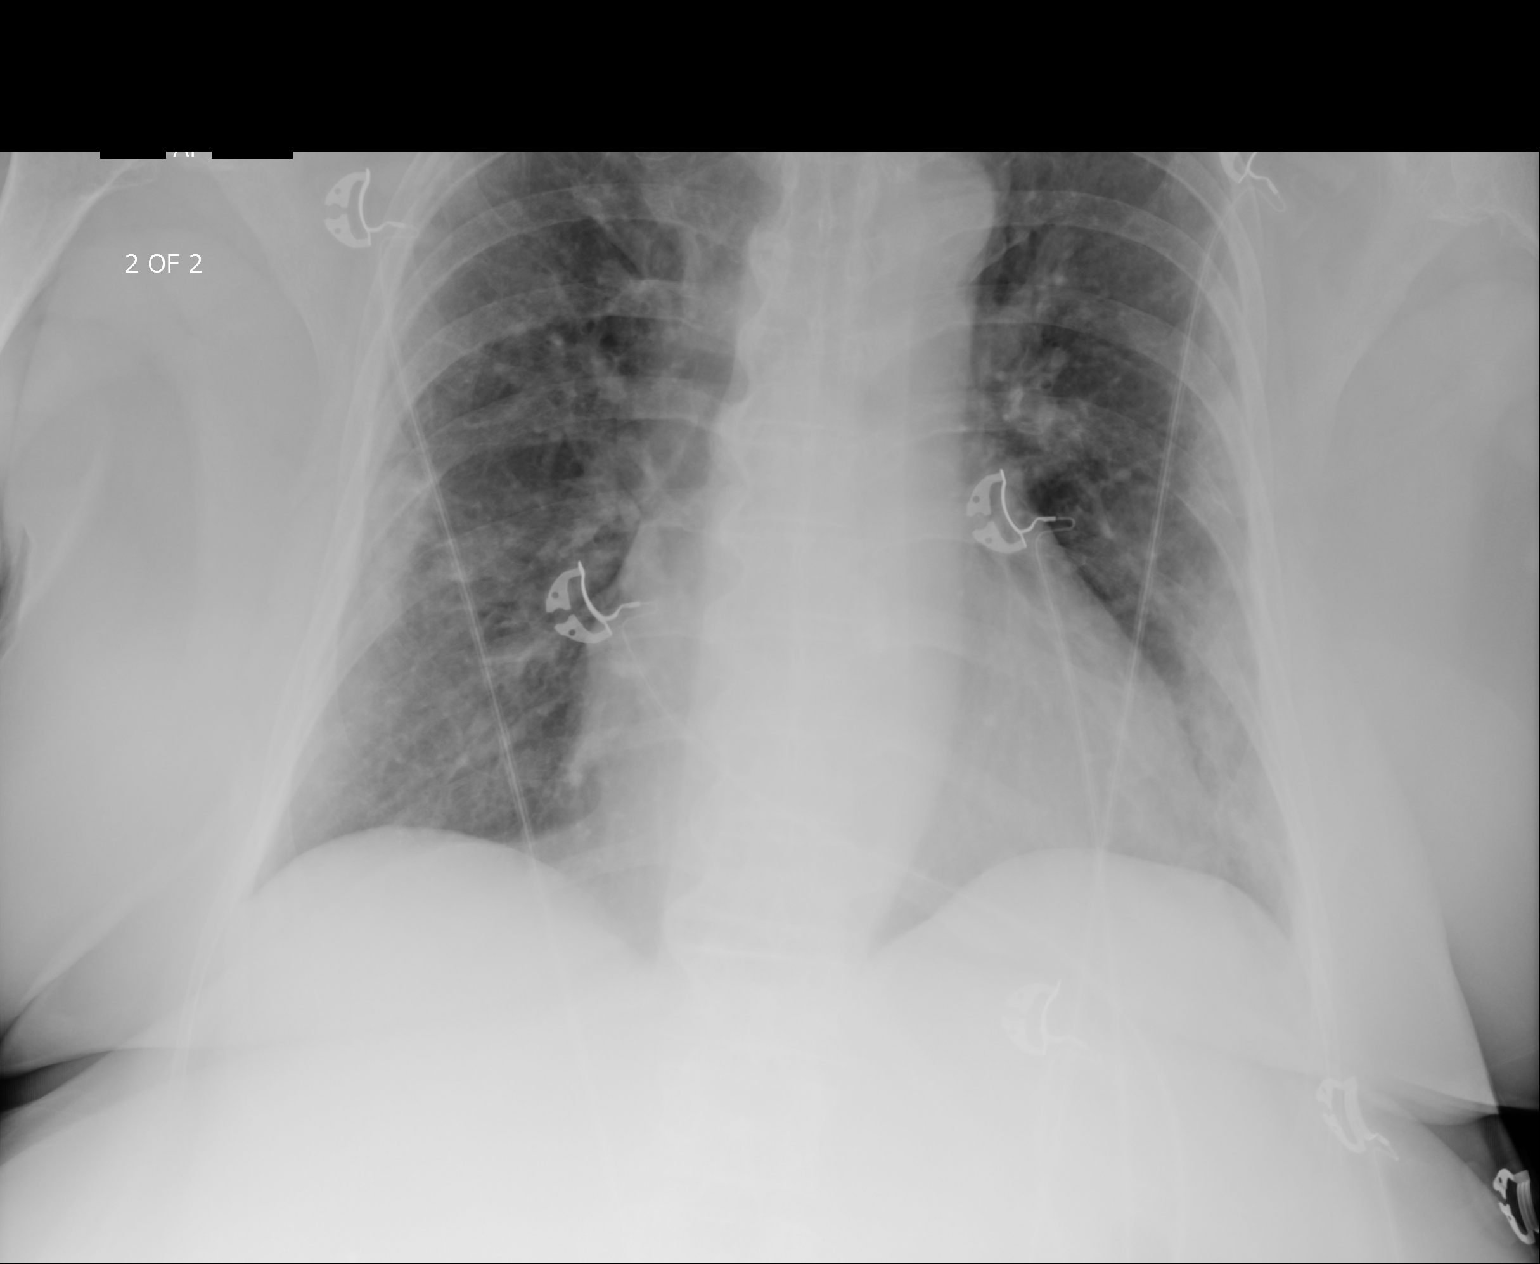

[2 of 2 positions shown; findings below may reference images not displayed]

FINDINGS: Stable cardiomegaly. The mediastinal contours remain within normal
limits. Slightly increased patchy bilateral airspace opacities most
notably in the right mid lung. No pneumothorax or pleural effusion.
No acute osseous abnormality.
IMPRESSION: 1. Slight interval progression of bilateral patchy airspace
opacities, particularly in the right mid lung. Findings concerning
for progression of viral pneumonia.
2. Stable cardiomegaly.
